# Patient Record
Sex: Female | Born: 1935 | Race: White | Hispanic: No | Marital: Single | Smoking: Never smoker
Health system: Southern US, Community
[De-identification: ages and names within clinical notes are randomized; demographics above are authoritative.]

## PROBLEM LIST (undated history)

## (undated) DIAGNOSIS — I4891 Unspecified atrial fibrillation: Secondary | ICD-10-CM

## (undated) DIAGNOSIS — E785 Hyperlipidemia, unspecified: Secondary | ICD-10-CM

## (undated) DIAGNOSIS — I10 Essential (primary) hypertension: Secondary | ICD-10-CM

## (undated) DIAGNOSIS — D126 Benign neoplasm of colon, unspecified: Secondary | ICD-10-CM

## (undated) DIAGNOSIS — R739 Hyperglycemia, unspecified: Secondary | ICD-10-CM

## (undated) DIAGNOSIS — M1990 Unspecified osteoarthritis, unspecified site: Secondary | ICD-10-CM

## (undated) DIAGNOSIS — I340 Nonrheumatic mitral (valve) insufficiency: Secondary | ICD-10-CM

## (undated) DIAGNOSIS — E0590 Thyrotoxicosis, unspecified without thyrotoxic crisis or storm: Secondary | ICD-10-CM

## (undated) DIAGNOSIS — I639 Cerebral infarction, unspecified: Secondary | ICD-10-CM

## (undated) HISTORY — DX: Benign neoplasm of colon, unspecified: D12.6

## (undated) HISTORY — DX: Unspecified atrial fibrillation: I48.91

## (undated) HISTORY — PX: OTHER SURGICAL HISTORY: SHX169

## (undated) HISTORY — DX: Unspecified osteoarthritis, unspecified site: M19.90

## (undated) HISTORY — DX: Nonrheumatic mitral (valve) insufficiency: I34.0

## (undated) HISTORY — DX: Cerebral infarction, unspecified: I63.9

## (undated) HISTORY — DX: Essential (primary) hypertension: I10

## (undated) HISTORY — DX: Thyrotoxicosis, unspecified without thyrotoxic crisis or storm: E05.90

## (undated) HISTORY — DX: Hyperlipidemia, unspecified: E78.5

## (undated) HISTORY — PX: ABDOMINAL HYSTERECTOMY: SHX81

## (undated) HISTORY — DX: Hyperglycemia, unspecified: R73.9

---

## 1998-02-08 ENCOUNTER — Other Ambulatory Visit: Admission: RE | Admit: 1998-02-08 | Discharge: 1998-02-08 | Payer: Self-pay | Admitting: *Deleted

## 1998-10-24 ENCOUNTER — Other Ambulatory Visit: Admission: RE | Admit: 1998-10-24 | Discharge: 1998-10-24 | Payer: Self-pay | Admitting: Otolaryngology

## 1998-11-30 ENCOUNTER — Inpatient Hospital Stay (HOSPITAL_COMMUNITY): Admission: RE | Admit: 1998-11-30 | Discharge: 1998-12-03 | Payer: Self-pay | Admitting: Otolaryngology

## 1999-04-29 ENCOUNTER — Other Ambulatory Visit: Admission: RE | Admit: 1999-04-29 | Discharge: 1999-04-29 | Payer: Self-pay | Admitting: *Deleted

## 1999-08-22 ENCOUNTER — Ambulatory Visit: Admission: RE | Admit: 1999-08-22 | Discharge: 1999-08-22 | Payer: Self-pay | Admitting: Endocrinology

## 1999-08-22 ENCOUNTER — Encounter: Payer: Self-pay | Admitting: Endocrinology

## 1999-11-25 ENCOUNTER — Ambulatory Visit (HOSPITAL_COMMUNITY): Admission: RE | Admit: 1999-11-25 | Discharge: 1999-11-25 | Payer: Self-pay | Admitting: Vascular Surgery

## 1999-11-25 ENCOUNTER — Encounter: Payer: Self-pay | Admitting: Vascular Surgery

## 1999-12-19 ENCOUNTER — Encounter: Payer: Self-pay | Admitting: Vascular Surgery

## 1999-12-19 ENCOUNTER — Observation Stay (HOSPITAL_COMMUNITY): Admission: RE | Admit: 1999-12-19 | Discharge: 1999-12-20 | Payer: Self-pay | Admitting: Vascular Surgery

## 2000-01-29 ENCOUNTER — Encounter: Payer: Self-pay | Admitting: Vascular Surgery

## 2000-01-29 ENCOUNTER — Ambulatory Visit (HOSPITAL_COMMUNITY): Admission: RE | Admit: 2000-01-29 | Discharge: 2000-01-29 | Payer: Self-pay | Admitting: Vascular Surgery

## 2000-04-17 ENCOUNTER — Encounter: Admission: RE | Admit: 2000-04-17 | Discharge: 2000-04-17 | Payer: Self-pay | Admitting: Endocrinology

## 2000-04-17 ENCOUNTER — Encounter: Payer: Self-pay | Admitting: Endocrinology

## 2000-05-05 ENCOUNTER — Other Ambulatory Visit: Admission: RE | Admit: 2000-05-05 | Discharge: 2000-05-05 | Payer: Self-pay | Admitting: *Deleted

## 2000-10-06 DIAGNOSIS — D126 Benign neoplasm of colon, unspecified: Secondary | ICD-10-CM

## 2000-10-06 HISTORY — DX: Benign neoplasm of colon, unspecified: D12.6

## 2000-11-02 ENCOUNTER — Other Ambulatory Visit: Admission: RE | Admit: 2000-11-02 | Discharge: 2000-11-02 | Payer: Self-pay | Admitting: Gastroenterology

## 2000-11-02 ENCOUNTER — Encounter (INDEPENDENT_AMBULATORY_CARE_PROVIDER_SITE_OTHER): Payer: Self-pay | Admitting: Specialist

## 2000-11-02 ENCOUNTER — Encounter: Payer: Self-pay | Admitting: Endocrinology

## 2000-11-02 ENCOUNTER — Encounter: Payer: Self-pay | Admitting: Gastroenterology

## 2000-11-23 ENCOUNTER — Encounter: Payer: Self-pay | Admitting: Gastroenterology

## 2000-11-23 ENCOUNTER — Encounter (INDEPENDENT_AMBULATORY_CARE_PROVIDER_SITE_OTHER): Payer: Self-pay

## 2000-11-23 ENCOUNTER — Ambulatory Visit (HOSPITAL_COMMUNITY): Admission: RE | Admit: 2000-11-23 | Discharge: 2000-11-23 | Payer: Self-pay | Admitting: Gastroenterology

## 2000-12-17 ENCOUNTER — Encounter: Payer: Self-pay | Admitting: General Surgery

## 2000-12-24 ENCOUNTER — Encounter (INDEPENDENT_AMBULATORY_CARE_PROVIDER_SITE_OTHER): Payer: Self-pay | Admitting: Specialist

## 2000-12-24 ENCOUNTER — Inpatient Hospital Stay (HOSPITAL_COMMUNITY): Admission: RE | Admit: 2000-12-24 | Discharge: 2000-12-28 | Payer: Self-pay | Admitting: General Surgery

## 2001-04-27 ENCOUNTER — Encounter: Payer: Self-pay | Admitting: *Deleted

## 2001-04-27 ENCOUNTER — Encounter: Admission: RE | Admit: 2001-04-27 | Discharge: 2001-04-27 | Payer: Self-pay | Admitting: *Deleted

## 2001-05-18 ENCOUNTER — Other Ambulatory Visit: Admission: RE | Admit: 2001-05-18 | Discharge: 2001-05-18 | Payer: Self-pay | Admitting: *Deleted

## 2001-12-24 ENCOUNTER — Encounter: Payer: Self-pay | Admitting: Gastroenterology

## 2002-05-09 ENCOUNTER — Encounter: Admission: RE | Admit: 2002-05-09 | Discharge: 2002-05-09 | Payer: Self-pay | Admitting: *Deleted

## 2002-05-09 ENCOUNTER — Encounter: Payer: Self-pay | Admitting: *Deleted

## 2002-12-01 ENCOUNTER — Ambulatory Visit (HOSPITAL_COMMUNITY): Admission: RE | Admit: 2002-12-01 | Discharge: 2002-12-01 | Payer: Self-pay | Admitting: Ophthalmology

## 2003-05-18 ENCOUNTER — Encounter: Payer: Self-pay | Admitting: Obstetrics and Gynecology

## 2003-05-18 ENCOUNTER — Encounter: Admission: RE | Admit: 2003-05-18 | Discharge: 2003-05-18 | Payer: Self-pay | Admitting: Obstetrics and Gynecology

## 2003-09-20 HISTORY — PX: OTHER SURGICAL HISTORY: SHX169

## 2004-05-29 ENCOUNTER — Encounter: Admission: RE | Admit: 2004-05-29 | Discharge: 2004-05-29 | Payer: Self-pay | Admitting: Obstetrics and Gynecology

## 2004-10-06 HISTORY — PX: COLON RESECTION: SHX5231

## 2004-10-15 ENCOUNTER — Ambulatory Visit: Payer: Self-pay | Admitting: Endocrinology

## 2004-10-23 ENCOUNTER — Ambulatory Visit: Payer: Self-pay | Admitting: Endocrinology

## 2004-12-04 ENCOUNTER — Ambulatory Visit: Payer: Self-pay | Admitting: Gastroenterology

## 2004-12-25 ENCOUNTER — Ambulatory Visit: Payer: Self-pay | Admitting: Gastroenterology

## 2005-04-27 ENCOUNTER — Emergency Department (HOSPITAL_COMMUNITY): Admission: EM | Admit: 2005-04-27 | Discharge: 2005-04-27 | Payer: Self-pay | Admitting: *Deleted

## 2005-06-10 ENCOUNTER — Encounter: Admission: RE | Admit: 2005-06-10 | Discharge: 2005-06-10 | Payer: Self-pay | Admitting: Obstetrics and Gynecology

## 2005-12-18 ENCOUNTER — Ambulatory Visit: Payer: Self-pay | Admitting: Endocrinology

## 2005-12-22 ENCOUNTER — Ambulatory Visit: Payer: Self-pay | Admitting: Family Medicine

## 2005-12-29 HISTORY — PX: OTHER SURGICAL HISTORY: SHX169

## 2006-06-15 ENCOUNTER — Encounter: Admission: RE | Admit: 2006-06-15 | Discharge: 2006-06-15 | Payer: Self-pay | Admitting: Obstetrics and Gynecology

## 2006-10-06 DIAGNOSIS — I4891 Unspecified atrial fibrillation: Secondary | ICD-10-CM

## 2006-10-06 HISTORY — DX: Unspecified atrial fibrillation: I48.91

## 2007-01-05 ENCOUNTER — Ambulatory Visit: Payer: Self-pay | Admitting: Endocrinology

## 2007-01-05 LAB — CONVERTED CEMR LAB
ALT: 22 units/L (ref 0–40)
Bilirubin, Direct: 0.1 mg/dL (ref 0.0–0.3)
CO2: 29 meq/L (ref 19–32)
Calcium: 9.3 mg/dL (ref 8.4–10.5)
Cholesterol: 137 mg/dL (ref 0–200)
GFR calc Af Amer: 80 mL/min
GFR calc non Af Amer: 66 mL/min
Glucose, Bld: 109 mg/dL — ABNORMAL HIGH (ref 70–99)
HDL: 61.2 mg/dL (ref >39.0)
Hgb A1c MFr Bld: 5.4 % (ref 4.6–6.0)
LDL Cholesterol: 62 mg/dL (ref 0–99)
Sodium: 142 meq/L (ref 135–145)
Total CHOL/HDL Ratio: 2.2
Total Protein: 6.3 g/dL (ref 6.0–8.3)
Triglycerides: 68 mg/dL (ref 0–149)

## 2007-05-10 ENCOUNTER — Ambulatory Visit: Payer: Self-pay | Admitting: Endocrinology

## 2007-05-10 LAB — CONVERTED CEMR LAB
Basophils Absolute: 0.1 10*3/uL (ref 0.0–0.1)
Calcium: 9.2 mg/dL (ref 8.4–10.5)
Chloride: 109 meq/L (ref 96–112)
Creatinine, Ser: 1 mg/dL (ref 0.4–1.2)
Eosinophils Absolute: 0.1 10*3/uL (ref 0.0–0.6)
Eosinophils Relative: 1.6 % (ref 0.0–5.0)
GFR calc non Af Amer: 58 mL/min
HCT: 34.4 % — ABNORMAL LOW (ref 36.0–46.0)
MCHC: 35 g/dL (ref 30.0–36.0)
MCV: 89.6 fL (ref 78.0–100.0)
Platelets: 217 10*3/uL (ref 150–400)
RBC: 3.83 M/uL — ABNORMAL LOW (ref 3.87–5.11)
RDW: 12.6 % (ref 11.5–14.6)
WBC: 7.3 10*3/uL (ref 4.5–10.5)

## 2007-05-18 ENCOUNTER — Ambulatory Visit: Payer: Self-pay

## 2007-06-04 ENCOUNTER — Encounter: Payer: Self-pay | Admitting: Endocrinology

## 2007-06-04 DIAGNOSIS — M810 Age-related osteoporosis without current pathological fracture: Secondary | ICD-10-CM

## 2007-06-04 DIAGNOSIS — I10 Essential (primary) hypertension: Secondary | ICD-10-CM

## 2007-06-04 DIAGNOSIS — M1990 Unspecified osteoarthritis, unspecified site: Secondary | ICD-10-CM | POA: Insufficient documentation

## 2007-06-22 ENCOUNTER — Encounter: Admission: RE | Admit: 2007-06-22 | Discharge: 2007-06-22 | Payer: Self-pay | Admitting: Obstetrics and Gynecology

## 2008-01-10 ENCOUNTER — Encounter: Payer: Self-pay | Admitting: Endocrinology

## 2008-01-10 ENCOUNTER — Ambulatory Visit: Payer: Self-pay | Admitting: Internal Medicine

## 2008-02-10 ENCOUNTER — Ambulatory Visit: Payer: Self-pay | Admitting: Endocrinology

## 2008-02-10 DIAGNOSIS — R739 Hyperglycemia, unspecified: Secondary | ICD-10-CM

## 2008-02-10 DIAGNOSIS — E7800 Pure hypercholesterolemia, unspecified: Secondary | ICD-10-CM | POA: Insufficient documentation

## 2008-02-10 DIAGNOSIS — H9190 Unspecified hearing loss, unspecified ear: Secondary | ICD-10-CM | POA: Insufficient documentation

## 2008-02-10 DIAGNOSIS — M25569 Pain in unspecified knee: Secondary | ICD-10-CM

## 2008-02-10 LAB — CONVERTED CEMR LAB
Albumin: 3.7 g/dL (ref 3.5–5.2)
Alkaline Phosphatase: 82 units/L (ref 39–117)
BUN: 11 mg/dL (ref 6–23)
Bacteria, UA: NEGATIVE
Basophils Absolute: 0 10*3/uL (ref 0.0–0.1)
Cholesterol: 145 mg/dL (ref 0–200)
Creatinine, Ser: 0.7 mg/dL (ref 0.4–1.2)
Crystals: NEGATIVE
Eosinophils Absolute: 0.1 10*3/uL (ref 0.0–0.7)
GFR calc Af Amer: 106 mL/min
GFR calc non Af Amer: 88 mL/min
HDL: 60.2 mg/dL (ref >39.0)
Hgb A1c MFr Bld: 5.3 % (ref 4.6–6.0)
Ketones, ur: NEGATIVE mg/dL
LDL Cholesterol: 74 mg/dL (ref 0–99)
Leukocytes, UA: NEGATIVE
Lymphocytes Relative: 16.9 % (ref 12.0–46.0)
MCHC: 33.7 g/dL (ref 30.0–36.0)
MCV: 89 fL (ref 78.0–100.0)
Neutrophils Relative %: 72.2 % (ref 43.0–77.0)
Nitrite: NEGATIVE
Platelets: 216 10*3/uL (ref 150–400)
Potassium: 4.8 meq/L (ref 3.5–5.1)
RBC / HPF: NONE SEEN
RDW: 13 % (ref 11.5–14.6)
Sed Rate: 12 mm/hr (ref 0–22)
Specific Gravity, Urine: 1.01 (ref 1.000–1.03)
TSH: 3.53 microintl units/mL (ref 0.35–5.50)
Urobilinogen, UA: 0.2 (ref 0.0–1.0)
VLDL: 11 mg/dL (ref 0–40)

## 2008-06-28 ENCOUNTER — Encounter: Admission: RE | Admit: 2008-06-28 | Discharge: 2008-06-28 | Payer: Self-pay | Admitting: Obstetrics and Gynecology

## 2008-06-29 ENCOUNTER — Encounter: Admission: RE | Admit: 2008-06-29 | Discharge: 2008-06-29 | Payer: Self-pay | Admitting: Obstetrics and Gynecology

## 2008-07-04 ENCOUNTER — Telehealth (INDEPENDENT_AMBULATORY_CARE_PROVIDER_SITE_OTHER): Payer: Self-pay | Admitting: *Deleted

## 2008-07-04 ENCOUNTER — Ambulatory Visit: Payer: Self-pay | Admitting: Endocrinology

## 2008-07-31 ENCOUNTER — Encounter: Payer: Self-pay | Admitting: Endocrinology

## 2008-09-14 ENCOUNTER — Ambulatory Visit: Payer: Self-pay | Admitting: Endocrinology

## 2008-09-14 DIAGNOSIS — IMO0002 Reserved for concepts with insufficient information to code with codable children: Secondary | ICD-10-CM | POA: Insufficient documentation

## 2008-09-14 LAB — CONVERTED CEMR LAB
Bilirubin Urine: NEGATIVE
Urobilinogen, UA: 0.2
pH: 5

## 2008-09-19 ENCOUNTER — Telehealth (INDEPENDENT_AMBULATORY_CARE_PROVIDER_SITE_OTHER): Payer: Self-pay | Admitting: *Deleted

## 2008-10-06 LAB — CONVERTED CEMR LAB: Pap Smear: NORMAL

## 2009-02-07 ENCOUNTER — Ambulatory Visit: Payer: Self-pay | Admitting: Endocrinology

## 2009-02-07 DIAGNOSIS — I498 Other specified cardiac arrhythmias: Secondary | ICD-10-CM | POA: Insufficient documentation

## 2009-02-07 LAB — CONVERTED CEMR LAB
ALT: 21 units/L (ref 0–35)
Albumin: 3.6 g/dL (ref 3.5–5.2)
Alkaline Phosphatase: 77 units/L (ref 39–117)
Basophils Relative: 0.2 % (ref 0.0–3.0)
Bilirubin Urine: NEGATIVE
CO2: 32 meq/L (ref 19–32)
Chloride: 111 meq/L (ref 96–112)
Eosinophils Absolute: 0.2 10*3/uL (ref 0.0–0.7)
Hemoglobin, Urine: NEGATIVE
Hemoglobin: 12.9 g/dL (ref 12.0–15.0)
Ketones, ur: NEGATIVE mg/dL
Lymphocytes Relative: 14.8 % (ref 12.0–46.0)
MCHC: 35.1 g/dL (ref 30.0–36.0)
MCV: 91 fL (ref 78.0–100.0)
Monocytes Absolute: 0.6 10*3/uL (ref 0.1–1.0)
Neutro Abs: 4.8 10*3/uL (ref 1.4–7.7)
Nitrite: NEGATIVE
RBC: 4.03 M/uL (ref 3.87–5.11)
Sodium: 148 meq/L — ABNORMAL HIGH (ref 135–145)
Total CHOL/HDL Ratio: 2
Total Protein, Urine: NEGATIVE mg/dL
Total Protein: 6.3 g/dL (ref 6.0–8.3)
Urine Glucose: NEGATIVE mg/dL

## 2009-02-14 ENCOUNTER — Telehealth: Payer: Self-pay | Admitting: Endocrinology

## 2009-04-23 ENCOUNTER — Telehealth: Payer: Self-pay | Admitting: Internal Medicine

## 2009-04-23 ENCOUNTER — Ambulatory Visit: Payer: Self-pay | Admitting: Internal Medicine

## 2009-04-23 ENCOUNTER — Encounter: Admission: RE | Admit: 2009-04-23 | Discharge: 2009-04-23 | Payer: Self-pay | Admitting: Internal Medicine

## 2009-04-23 DIAGNOSIS — I6350 Cerebral infarction due to unspecified occlusion or stenosis of unspecified cerebral artery: Secondary | ICD-10-CM | POA: Insufficient documentation

## 2009-04-24 ENCOUNTER — Telehealth: Payer: Self-pay | Admitting: Internal Medicine

## 2009-04-24 ENCOUNTER — Encounter: Admission: RE | Admit: 2009-04-24 | Discharge: 2009-04-24 | Payer: Self-pay | Admitting: Internal Medicine

## 2009-04-25 ENCOUNTER — Telehealth: Payer: Self-pay | Admitting: Internal Medicine

## 2009-04-25 LAB — CONVERTED CEMR LAB
BUN: 18 mg/dL (ref 6–23)
Bilirubin Urine: NEGATIVE
Bilirubin, Direct: 0.2 mg/dL (ref 0.0–0.3)
CO2: 30 meq/L (ref 19–32)
Chloride: 105 meq/L (ref 96–112)
Creatinine, Ser: 0.9 mg/dL (ref 0.4–1.2)
Eosinophils Absolute: 0 10*3/uL (ref 0.0–0.7)
Glucose, Bld: 112 mg/dL — ABNORMAL HIGH (ref 70–99)
HCT: 36 % (ref 36.0–46.0)
Hemoglobin, Urine: NEGATIVE
Leukocytes, UA: NEGATIVE
Lymphs Abs: 1 10*3/uL (ref 0.7–4.0)
MCHC: 35 g/dL (ref 30.0–36.0)
MCV: 90.6 fL (ref 78.0–100.0)
Monocytes Absolute: 0.7 10*3/uL (ref 0.1–1.0)
Neutrophils Relative %: 67.8 % (ref 43.0–77.0)
Nitrite: NEGATIVE
Platelets: 173 10*3/uL (ref 150.0–400.0)
RDW: 12.3 % (ref 11.5–14.6)
TSH: 2.08 microintl units/mL (ref 0.35–5.50)
Total Bilirubin: 0.7 mg/dL (ref 0.3–1.2)
Total Protein, Urine: NEGATIVE mg/dL
pH: 5 (ref 5.0–8.0)

## 2009-04-27 ENCOUNTER — Emergency Department (HOSPITAL_COMMUNITY): Admission: EM | Admit: 2009-04-27 | Discharge: 2009-04-28 | Payer: Self-pay | Admitting: Emergency Medicine

## 2009-04-30 ENCOUNTER — Ambulatory Visit: Payer: Self-pay | Admitting: Internal Medicine

## 2009-06-19 ENCOUNTER — Ambulatory Visit: Payer: Self-pay | Admitting: Endocrinology

## 2009-06-27 ENCOUNTER — Encounter: Payer: Self-pay | Admitting: Endocrinology

## 2009-06-27 ENCOUNTER — Ambulatory Visit: Payer: Self-pay

## 2009-07-03 ENCOUNTER — Telehealth: Payer: Self-pay | Admitting: Endocrinology

## 2009-07-04 ENCOUNTER — Telehealth: Payer: Self-pay | Admitting: Endocrinology

## 2009-07-05 ENCOUNTER — Telehealth: Payer: Self-pay | Admitting: Endocrinology

## 2009-07-09 ENCOUNTER — Telehealth: Payer: Self-pay | Admitting: Endocrinology

## 2009-07-10 ENCOUNTER — Ambulatory Visit: Payer: Self-pay | Admitting: Internal Medicine

## 2009-07-10 DIAGNOSIS — I080 Rheumatic disorders of both mitral and aortic valves: Secondary | ICD-10-CM | POA: Insufficient documentation

## 2009-07-12 ENCOUNTER — Ambulatory Visit (HOSPITAL_COMMUNITY): Admission: RE | Admit: 2009-07-12 | Discharge: 2009-07-12 | Payer: Self-pay | Admitting: Cardiology

## 2009-07-12 ENCOUNTER — Ambulatory Visit: Payer: Self-pay

## 2009-07-12 ENCOUNTER — Ambulatory Visit: Payer: Self-pay | Admitting: Internal Medicine

## 2009-07-12 ENCOUNTER — Ambulatory Visit: Payer: Self-pay | Admitting: Cardiology

## 2009-07-12 ENCOUNTER — Encounter: Payer: Self-pay | Admitting: Internal Medicine

## 2009-08-08 ENCOUNTER — Telehealth: Payer: Self-pay | Admitting: Endocrinology

## 2009-08-15 ENCOUNTER — Ambulatory Visit: Payer: Self-pay | Admitting: Internal Medicine

## 2009-08-15 DIAGNOSIS — I4891 Unspecified atrial fibrillation: Secondary | ICD-10-CM

## 2009-08-16 ENCOUNTER — Telehealth: Payer: Self-pay | Admitting: Internal Medicine

## 2009-08-16 ENCOUNTER — Encounter: Payer: Self-pay | Admitting: Endocrinology

## 2009-08-22 ENCOUNTER — Encounter: Admission: RE | Admit: 2009-08-22 | Discharge: 2009-08-22 | Payer: Self-pay | Admitting: Endocrinology

## 2009-08-24 ENCOUNTER — Telehealth: Payer: Self-pay | Admitting: Internal Medicine

## 2009-08-26 ENCOUNTER — Encounter: Admission: RE | Admit: 2009-08-26 | Discharge: 2009-08-26 | Payer: Self-pay | Admitting: Neurology

## 2009-09-19 ENCOUNTER — Telehealth: Payer: Self-pay | Admitting: Internal Medicine

## 2009-10-29 ENCOUNTER — Telehealth: Payer: Self-pay | Admitting: Endocrinology

## 2009-11-20 ENCOUNTER — Ambulatory Visit: Payer: Self-pay | Admitting: Internal Medicine

## 2009-11-20 DIAGNOSIS — R609 Edema, unspecified: Secondary | ICD-10-CM

## 2009-11-27 ENCOUNTER — Encounter: Payer: Self-pay | Admitting: Endocrinology

## 2009-12-17 ENCOUNTER — Ambulatory Visit: Payer: Self-pay | Admitting: Internal Medicine

## 2009-12-19 ENCOUNTER — Encounter (INDEPENDENT_AMBULATORY_CARE_PROVIDER_SITE_OTHER): Payer: Self-pay | Admitting: *Deleted

## 2009-12-20 ENCOUNTER — Telehealth: Payer: Self-pay | Admitting: Internal Medicine

## 2009-12-20 LAB — CONVERTED CEMR LAB
BUN: 14 mg/dL (ref 6–23)
Calcium: 9.3 mg/dL (ref 8.4–10.5)
GFR calc non Af Amer: 87.44 mL/min (ref >60)
Glucose, Bld: 81 mg/dL (ref 70–99)
Potassium: 3.9 meq/L (ref 3.5–5.1)
Sodium: 142 meq/L (ref 135–145)

## 2009-12-21 ENCOUNTER — Ambulatory Visit: Payer: Self-pay | Admitting: Internal Medicine

## 2009-12-31 ENCOUNTER — Encounter (INDEPENDENT_AMBULATORY_CARE_PROVIDER_SITE_OTHER): Payer: Self-pay | Admitting: *Deleted

## 2009-12-31 ENCOUNTER — Telehealth: Payer: Self-pay | Admitting: Internal Medicine

## 2009-12-31 LAB — CONVERTED CEMR LAB
Basophils Absolute: 0 10*3/uL (ref 0.0–0.1)
Eosinophils Relative: 0.8 % (ref 0.0–5.0)
HCT: 37.6 % (ref 36.0–46.0)
Lymphocytes Relative: 19.5 % (ref 12.0–46.0)
Lymphs Abs: 1.2 10*3/uL (ref 0.7–4.0)
Monocytes Relative: 10.7 % (ref 3.0–12.0)
Neutrophils Relative %: 68.3 % (ref 43.0–77.0)
Platelets: 166 10*3/uL (ref 150.0–400.0)
RDW: 12.9 % (ref 11.5–14.6)
WBC: 6.1 10*3/uL (ref 4.5–10.5)

## 2010-01-24 ENCOUNTER — Telehealth: Payer: Self-pay | Admitting: Internal Medicine

## 2010-01-30 ENCOUNTER — Telehealth: Payer: Self-pay | Admitting: Internal Medicine

## 2010-02-06 ENCOUNTER — Ambulatory Visit: Payer: Self-pay | Admitting: Gastroenterology

## 2010-02-06 DIAGNOSIS — Z860100 Personal history of colon polyps, unspecified: Secondary | ICD-10-CM | POA: Insufficient documentation

## 2010-02-06 DIAGNOSIS — D68318 Other hemorrhagic disorder due to intrinsic circulating anticoagulants, antibodies, or inhibitors: Secondary | ICD-10-CM | POA: Insufficient documentation

## 2010-02-06 DIAGNOSIS — H409 Unspecified glaucoma: Secondary | ICD-10-CM

## 2010-02-11 ENCOUNTER — Telehealth (INDEPENDENT_AMBULATORY_CARE_PROVIDER_SITE_OTHER): Payer: Self-pay | Admitting: *Deleted

## 2010-02-12 ENCOUNTER — Ambulatory Visit: Payer: Self-pay | Admitting: Gastroenterology

## 2010-02-14 ENCOUNTER — Encounter: Payer: Self-pay | Admitting: Gastroenterology

## 2010-03-05 ENCOUNTER — Telehealth: Payer: Self-pay | Admitting: Cardiology

## 2010-03-18 ENCOUNTER — Telehealth (INDEPENDENT_AMBULATORY_CARE_PROVIDER_SITE_OTHER): Payer: Self-pay | Admitting: *Deleted

## 2010-03-27 ENCOUNTER — Telehealth: Payer: Self-pay | Admitting: Internal Medicine

## 2010-04-15 ENCOUNTER — Encounter: Payer: Self-pay | Admitting: Endocrinology

## 2010-04-15 ENCOUNTER — Ambulatory Visit: Payer: Self-pay | Admitting: Internal Medicine

## 2010-04-25 ENCOUNTER — Ambulatory Visit: Payer: Self-pay | Admitting: Endocrinology

## 2010-04-25 LAB — CONVERTED CEMR LAB
Albumin: 4 g/dL (ref 3.5–5.2)
Alkaline Phosphatase: 86 units/L (ref 39–117)
BUN: 12 mg/dL (ref 6–23)
Basophils Absolute: 0 10*3/uL (ref 0.0–0.1)
CO2: 29 meq/L (ref 19–32)
Calcium: 9.5 mg/dL (ref 8.4–10.5)
Cholesterol: 125 mg/dL (ref 0–200)
Creatinine, Ser: 0.7 mg/dL (ref 0.4–1.2)
Eosinophils Absolute: 0 10*3/uL (ref 0.0–0.7)
GFR calc non Af Amer: 87.36 mL/min (ref >60)
Glucose, Bld: 104 mg/dL — ABNORMAL HIGH (ref 70–99)
HDL: 64.1 mg/dL (ref >39.00)
Hemoglobin: 13 g/dL (ref 12.0–15.0)
Lymphocytes Relative: 13.7 % (ref 12.0–46.0)
MCHC: 34.8 g/dL (ref 30.0–36.0)
Monocytes Relative: 9.3 % (ref 3.0–12.0)
Neutro Abs: 5 10*3/uL (ref 1.4–7.7)
Nitrite: NEGATIVE
Platelets: 180 10*3/uL (ref 150.0–400.0)
RDW: 13.2 % (ref 11.5–14.6)
Sodium: 146 meq/L — ABNORMAL HIGH (ref 135–145)
Specific Gravity, Urine: 1.015 (ref 1.000–1.030)
TSH: 2.72 microintl units/mL (ref 0.35–5.50)
Total Bilirubin: 0.8 mg/dL (ref 0.3–1.2)
Total Protein, Urine: NEGATIVE mg/dL
Triglycerides: 44 mg/dL (ref 0.0–149.0)
Urobilinogen, UA: 0.2 (ref 0.0–1.0)
VLDL: 8.8 mg/dL (ref 0.0–40.0)

## 2010-05-21 ENCOUNTER — Ambulatory Visit: Payer: Self-pay | Admitting: Internal Medicine

## 2010-06-21 ENCOUNTER — Emergency Department (HOSPITAL_COMMUNITY): Admission: EM | Admit: 2010-06-21 | Discharge: 2010-06-21 | Payer: Self-pay | Admitting: Emergency Medicine

## 2010-06-27 ENCOUNTER — Encounter: Payer: Self-pay | Admitting: Endocrinology

## 2010-06-27 DIAGNOSIS — I6529 Occlusion and stenosis of unspecified carotid artery: Secondary | ICD-10-CM | POA: Insufficient documentation

## 2010-06-28 ENCOUNTER — Encounter: Payer: Self-pay | Admitting: Cardiovascular Disease

## 2010-06-28 ENCOUNTER — Ambulatory Visit: Payer: Self-pay

## 2010-09-03 ENCOUNTER — Encounter: Admission: RE | Admit: 2010-09-03 | Discharge: 2010-09-03 | Payer: Self-pay | Admitting: Obstetrics and Gynecology

## 2010-09-09 ENCOUNTER — Telehealth: Payer: Self-pay | Admitting: Endocrinology

## 2010-10-27 ENCOUNTER — Encounter: Payer: Self-pay | Admitting: Endocrinology

## 2010-11-05 NOTE — Assessment & Plan Note (Signed)
Summary: F6M/DM   Referring Provider:  na Primary Provider:  Minus Breeding MD   History of Present Illness:   Nicole Hutchinson is seen in followup because of atrial fibrillation and bradycardia. She has mitral regurgitation with moderate left atrial enlargement by echo in the fall with a dimension of 48 mm. Her thromboembolic risk factors include hypertension gender age and prior thromboembolic episode demonstrated a MRI scanning.  She was started on Pradaxa   and she has tolerated this well.   The patient denies SOB, chest pain, or palpitations.  She saw Dr. Everardo All  a couple of weeks ago; he noted that her heart rate was slow. This left a cardiogram was reviewed and demonstrated sinus bradycardia at 56 with right bundle branch block.    Current Medications (verified): 1)  Mevacor 40 Mg  Tabs (Lovastatin) .... Take 2 By Mouth Qhs 2)  Losartan Potassium-Hctz 50-12.5 Mg Tabs (Losartan Potassium-Hctz) .... One Tablet By Mouth Once Dailt 3)  Pradaxa 150 Mg Caps (Dabigatran Etexilate Mesylate) .... Take One Capsule Two Times A Day 4)  Multivitamins  Tabs (Multiple Vitamin) .... Once Daily 5)  Xalatan 0.005 % Soln (Latanoprost) .... As Directed  Allergies (verified): 1)  ! Codeine 2)  ! Ace Inhibitors  Past History:  Past Medical History: Last updated: 01/31/2010 Mitral Regurgitation- moderate 2008 Hypertension Hyperthyroidism Osteoarthritis Osteoporosis Tubulovillous Adenoma of colon 2002 Dyslipidemia Hyperglycemia  Vital Signs:  Patient profile:   75 year old female Height:      54 inches Weight:      125 pounds BMI:     30.25 Pulse rate:   80 / minute Resp:     16 per minute BP sitting:   122 / 65  (right arm)  Vitals Entered By: Marrion Coy, CNA (May 21, 2010 1:37 PM)  Physical Exam  General:  The patient was alert and oriented in no acute distress. HEENT Normal.  Neck veins were flat, carotids were brisk.  Lungs were clear.  Heart sounds were regular without  murmurs or gallops.  Abdomen was soft with active bowel sounds. There is no clubbing cyanosis or edema. Skin Warm and dry     Impression & Recommendations:  Problem # 1:  ATRIAL FIBRILLATION (ICD-427.31) paroxysmal-on anticoagulation with Pradaxa  Problem # 2:  ESSENTIAL HYPERTENSION, BENIGN (ICD-401.1)  well-controlled Her updated medication list for this problem includes:    Losartan Potassium-hctz 50-12.5 Mg Tabs (Losartan potassium-hctz) ..... One tablet by mouth once dailt  Her updated medication list for this problem includes:    Losartan Potassium-hctz 50-12.5 Mg Tabs (Losartan potassium-hctz) ..... One tablet by mouth once dailt  Problem # 3:  BRADYCARDIA (ICD-427.89) bradycardia noted previously now in normal sinus rhythm; right bundle branch block is old  Patient Instructions: 1)  Your physician recommends that you schedule a follow-up appointment in: YEAR WITH DR Graciela Husbands 2)  Your physician recommends that you continue on your current medications as directed. Please refer to the Current Medication list given to you today.

## 2010-11-05 NOTE — Progress Notes (Signed)
Summary: lab results  Phone Note Call from Patient Call back at Home Phone [phone]   Caller: Patient Summary of Call: Pt calling for lab results Initial call taken by: Judie Grieve,  December 20, 2009 10:50 AM  Follow-up for Phone Call        called pt with results Follow-up by: Charolotte Capuchin, RN,  December 20, 2009 10:58 AM

## 2010-11-05 NOTE — Letter (Signed)
Summary: [facility]  [facility]   Imported By: Sherian Rein 11/29/2009 12:55:17  _____________________________________________________________________  External Attachment:    Type:   Image     Comment:   External Document

## 2010-11-05 NOTE — Progress Notes (Signed)
Summary: calling regarding form for medication  Phone Note Call from Patient Call back at Home Phone [phone]   Caller: Patient Summary of Call: Pt calling cheking on form for her medication Initial call taken by: Judie Grieve,  January 30, 2010 10:24 AM  Follow-up for Phone Call        Patient has form from January, she will use that form and call back with problems. Gypsy Balsam RN BSN  January 31, 2010 9:24 AM

## 2010-11-05 NOTE — Progress Notes (Signed)
Summary: mevacor  Phone Note Refill Request Message from:  Fax from Pharmacy on September 09, 2010 4:24 PM  Refills Requested: Medication #1:  MEVACOR 40 MG  TABS take 2 by mouth qhs   Dosage confirmed as above?Dosage Confirmed   Last Refilled: 09/09/2010  Method Requested: Electronic Next Appointment Scheduled: none Initial call taken by: Brenton Grills CMA Duncan Dull),  September 09, 2010 4:25 PM    Prescriptions: MEVACOR 40 MG  TABS (LOVASTATIN) take 2 by mouth qhs  #180 x 2   Entered by:   Brenton Grills CMA (AAMA)   Authorized by:   Minus Breeding MD   Signed by:   Brenton Grills CMA (AAMA) on 09/09/2010   Method used:   Electronically to        [facility]  [phone]* (retail)       [address]       [place], [place]  [address]       Ph: [phone] or [phone]       Fax: [phone]   RxID:   [id number]

## 2010-11-05 NOTE — Letter (Signed)
Summary: Patient Notice- Polyp Results  [facility]  [address] [place], [place] [address]   Phone: [phone]  Fax: [phone]        Feb 14, 2010 MRN: [id number]    Nicole Hutchinson [address] [place], [place]  [address]    Dear [name],  I am pleased to inform you that the colon polyp(s) removed during your recent colonoscopy was (were) found to be benign (no cancer detected) upon pathologic examination.  I recommend you have a repeat colonoscopy examination in _5 years to look for recurrent polyps, as having colon polyps increases your risk for having recurrent polyps or even colon cancer in the future.  Should you develop new or worsening symptoms of abdominal pain, bowel habit changes or bleeding from the rectum or bowels, please schedule an evaluation with either your primary care physician or with me.  Additional information/recommendations:  __ No further action with gastroenterology is needed at this time. Please      follow-up with your primary care physician for your other healthcare      needs.  __ Please call [phone] to schedule a return visit to review your      situation.  __ Please keep your follow-up visit as already scheduled.  __ Continue treatment plan as outlined the day of your exam.  Please call us if you are having persistent problems or have questions about your condition that have not been fully answered at this time.  Sincerely,  Louis Meckel MD  This letter has been electronically signed by your physician.  Appended Document: Patient Notice- Polyp Results letter mailed

## 2010-11-05 NOTE — Letter (Signed)
Summary: Colonoscopy Letter  [facility]  [address] [place], [place] [address]   Phone: [phone]  Fax: [phone]      December 19, 2009 MRN: [id number]   ASHLE STIEF [address] [place], [place]  [address]   Dear Ms. Franzel,   According to your medical record, it is time for you to schedule a Colonoscopy. The American Cancer Society recommends this procedure as a method to detect early colon cancer. Patients with a family history of colon cancer, or a personal history of colon polyps or inflammatory bowel disease are at increased risk.  This letter has beeen generated based on the recommendations made at the time of your procedure. If you feel that in your particular situation this may no longer apply, please contact our office.  Please call our office at [phone] to schedule this appointment or to update your records at your earliest convenience.  Thank you for cooperating with [place] to provide you with the very best care possible.   Sincerely,  Barbette Hair. Arlyce Dice, M.D.  [facility] Gastroenterology [facility] [phone]

## 2010-11-05 NOTE — Assessment & Plan Note (Signed)
Summary: per check out/saf   Referring Provider:  Romero Belling, MD Primary Provider:  Minus Breeding MD  CC:  pt states she is feeling pretty well.  She has no cardiac concerns at this time.  History of Present Illness:   Nicole Hutchinson is seen in followup because of atrial fibrillation and bradycardia. She has mitral regurgitation with moderate left atrial enlargement by echo in the fall with a dimension of 48 mm. Her thromboembolic risk factors include hypertension gender age and prior thromboembolic episode demonstrated a MRI scanning.  She was started on Pradaxa at her last visit  and she has tolerated this well. The patient denies SOB, chest pain, or palpitations   Allergies: 1)  ! Codeine 2)  ! Ace Inhibitors  Past History:  Past Medical History: Last updated: 07/10/2009 Mitral Regurgitation- moderate 2008 Hypertension Hyperthyroidism Osteoarthritis Osteoporosis Tubulovillous Adenoma of colon Dyslipidemia Hyperglycemia  Past Surgical History: Last updated: 06/04/2007 Hysterectomy (R) Hemi-Thyroidectomy Stress Cardiolite (09/20/2003) DEXA (12/29/2005)  Family History: Last updated: 02/10/2008 no cancer  Social History: Last updated: 04/30/2009 retired single Never Smoked Alcohol use-no Drug use-no Regular exercise-no  Vital Signs:  Patient profile:   75 year old female Height:      54 inches Weight:      135 pounds Pulse rate:   64 / minute Pulse rhythm:   regular BP sitting:   148 / 72  (right arm) Cuff size:   regular  Vitals Entered By: Nicole Hutchinson CMA (November 20, 2009 9:15 AM)  Physical Exam  General:  The patient was alert and oriented in no acute distress. HEENT Normal.  Neck veins were flat, carotids were brisk.  Lungs were clear.  Heart sounds were regular without murmurs or gallops.  Abdomen was soft with active bowel sounds. There is no clubbing cyanosis, 1+ edema Skin Warm and dry    Impression & Recommendations:  Problem  # 1:  ATRIAL FIBRILLATION (ICD-427.31) She has no recurrent atrial fibrillation by symptoms. She is taking Pradaxa and tolerating it well.  Problem # 2:  PERIPHERAL EDEMA (ICD-782.3) She has mild peripheral edema. Will change her losartan to include hydrochlorothiazide 12.5 mg daily.this will also help with her hypertension. We will check a metabolic profile in 4 weeks' time. I should note that her creatinine and potassium were normal last summer  Problem # 3:  HYPERTENSION (ICD-401.9) as above  The following medications were removed from the medication list:    Hydrochlorothiazide 25 Mg Tabs (Hydrochlorothiazide) .Marland Kitchen... Take one tablet by mouth daily. Her updated medication list for this problem includes:    Losartan Potassium-hctz 50-12.5 Mg Tabs (Losartan potassium-hctz) ..... One tablet by mouth once dailt  Patient Instructions: 1)  Your physician recommends that you schedule a follow-up appointment in: 6 MONTHS 2)  Your physician has recommended you make the following change in your medication: HYDROCHLORTHAZIDE 25MG  TAKE ONE HALF TABLET ONCE DAILY UNTIL GONE THEN  3)  START LOSARTAN HCT 50/12.5MG  ONE TABLET ONCE DAILY 4)  Your physician recommends that you return for lab work in: 4 WEEKS Prescriptions: LOSARTAN POTASSIUM-HCTZ 50-12.5 MG TABS (LOSARTAN POTASSIUM-HCTZ) ONE TABLET BY MOUTH ONCE DAILT  #90 x 4   Entered by:   Deliah Goody, RN   Authorized by:   Nathen May, MD, [facility]   Signed by:   Deliah Goody, RN on 11/20/2009   Method used:   Electronically to        [facility]  [phone]* (retail)  [address]       [place], [place]  [address]       Ph: [phone] or [phone]       Fax: [phone]   RxID:   [id number] HYDROCHLOROTHIAZIDE 25 MG TABS (HYDROCHLOROTHIAZIDE) Take one tablet by mouth daily.  #30 x 0   Entered by:   Deliah Goody, RN   Authorized by:   Nathen May, MD, [facility]   Signed by:   Deliah Goody, RN on 11/20/2009   Method used:   Electronically to        [facility]  [phone]* (retail)       [address]       [place], [place]  [address]       Ph: [phone] or [phone]       Fax: [phone]   RxID:   [id number]

## 2010-11-05 NOTE — Progress Notes (Signed)
  Phone Note Refill Request Message from:  Fax from Pharmacy on October 29, 2009 9:18 AM  Refills Requested: Medication #1:  FOSAMAX 70 MG  TABS TAKE 1 Q WEEK   Dosage confirmed as above?Dosage Confirmed Initial call taken by: Josph Macho CMA,  October 29, 2009 9:18 AM    Prescriptions: FOSAMAX 70 MG  TABS (ALENDRONATE SODIUM) TAKE 1 Q WEEK  #12 Each x 1   Entered by:   Josph Macho CMA   Authorized by:   Minus Breeding MD   Signed by:   Josph Macho CMA on 10/29/2009   Method used:   Electronically to        [facility]  [phone]* (retail)       [address]       [place], [place]  [address]       Ph: [phone] or [phone]       Fax: [phone]   RxID:   [id number]

## 2010-11-05 NOTE — Letter (Signed)
Summary: New Patient letter  [facility]  [address] [place], [place] [address]   Phone: [phone]  Fax: [phone]       12/31/2009 MRN: [id number]  Nicole Hutchinson [address] [place], [place]  [address]  Dear Ms. Nicole Hutchinson,  Welcome to the Gastroenterology Division at [facility].    You are scheduled to see Dr.  Arlyce Dice on 02/06/2010 at 9:00AM on the 3rd floor at [facility], [address]  We ask that you try to arrive at our office 15 minutes prior to your appointment time to allow for check-in.  We would like you to complete the enclosed self-administered evaluation form prior to your visit and bring it with you on the day of your appointment.  We will review it with you.  Also, please bring a complete list of all your medications or, if you prefer, bring the medication bottles and we will list them.  Please bring your insurance card so that we may make a copy of it.  If your insurance requires a referral to see a specialist, please bring your referral form from your primary care physician.  Co-payments are due at the time of your visit and may be paid by cash, check or credit card.     Your office visit will consist of a consult with your physician (includes a physical exam), any laboratory testing he/she may order, scheduling of any necessary diagnostic testing (e.g. x-ray, ultrasound, CT-scan), and scheduling of a procedure (e.g. Endoscopy, Colonoscopy) if required.  Please allow enough time on your schedule to allow for any/all of these possibilities.    If you cannot keep your appointment, please call [phone] to cancel or reschedule prior to your appointment date.  This allows [place] the opportunity to schedule an appointment for another patient in need of care.  If you do not cancel or reschedule by 5 p.m. the business day prior to your appointment date, you will be charged a $50.00 late cancellation/no-show fee.    Thank you for choosing  [facility] for your medical needs.  We appreciate the opportunity to care for you.  Please visit [place] at our website  to learn more about our practice.                     Sincerely,                                                             The Gastroenterology Division

## 2010-11-05 NOTE — Miscellaneous (Signed)
Summary: BONE DENSITY  Clinical Lists Changes  Orders: Added new Test order of T-Bone Densitometry (77080) - Signed Added new Test order of T-Lumbar Vertebral Assessment (77082) - Signed 

## 2010-11-05 NOTE — Procedures (Signed)
Summary: colonoscopy   Colonoscopy  Procedure date:  12/24/2001  Findings:      Results: Normal. Location:  [facility].    Comments:      Repeat colonoscopy in 3 years.    Colonoscopy  Procedure date:  12/24/2001  Findings:      Results: Normal. Location:  [facility].    Comments:      Repeat colonoscopy in 3 years.    Patient Name: Britney, Newstrom MRN:  Procedure Procedures: Colonoscopy CPT: [id number].  Personnel: Endoscopist: Barbette Hair. Arlyce Dice, MD.  Indications  Surveillance of: Adenomatous Polyp(s). 2002.  History  Pre-Exam Physical: Performed Dec 24, 2001. Entire physical exam was normal.  Exam Exam: Extent of exam reached: Cecum, extent intended: Cecum.  The cecum was identified by IC valve. Colon retroflexion performed. ASA Classification: II. Tolerance: good.  Monitoring: Pulse and BP monitoring, Oximetry used. Supplemental O2 given. at 2 Liters.  Colon Prep Used Golytely for colon prep. Prep results: good.  Sedation Meds: Fentanyl 50 mcg. given IV. Versed 2 mg. given IV.  Findings - NORMAL EXAM: Cecum to Sigmoid Colon.  PRIOR SURGERY: Sigmoid Colon. Segmental Colectomy.  - NORMAL EXAM: Sigmoid Colon to Rectum.   Assessment Normal examination.  Events  Unplanned Interventions: No intervention was required.  Unplanned Events: There were no complications. Plans Patient Education: Patient given standard instructions for: a normal exam.  Scheduling/Referral: Colonoscopy, to Molly Maduro D. Arlyce Dice, MD, around Dec 24, 2004.    This report was created from the original endoscopy report, which was reviewed and signed by the above listed endoscopist.

## 2010-11-05 NOTE — Progress Notes (Signed)
Summary: CALL RE MED  Phone Note Call from Patient Call back at Home Phone [phone]   Caller: Patient [phone] Reason for Call: Talk to Nurse Summary of Call: [facility] MED-PT HAS QUESTIONS ABOUT IT Initial call taken by: Glynda Jaeger,  March 18, 2010 3:12 PM  Follow-up for Phone Call        waiting  for approval for assistance with medication, reuqesting samples.  Pradaxa 150 mg capsules #24 lot [id number] exp 1/12 left at front desk for pt. she is aware. Follow-up by: Charolotte Capuchin, RN,  March 18, 2010 4:03 PM

## 2010-11-05 NOTE — Progress Notes (Signed)
Summary: lab results today  Phone Note Call from Patient Call back at Home Phone [phone]   Caller: Patient Reason for Call: Talk to Nurse, Lab or Test Results Summary of Call: Patient would like lab results today.  Drawn on 12-21-09. Initial call taken by: Burnard Leigh,  December 31, 2009 2:17 PM  Follow-up for Phone Call        Pt aware Follow-up by: Duncan Dull, RN, BSN,  December 31, 2009 2:41 PM

## 2010-11-05 NOTE — Progress Notes (Signed)
Summary: refill/call  Phone Note Call from Patient Call back at Home Phone [phone]   Caller: Patient Summary of Call: please call pt once rx sent Initial call taken by: Migdalia Dk,  December 20, 2009 11:47 AM  Follow-up for Phone Call        Received requests for Pradaxa refills.  Pt has not had  CBC since prescribed.  Forwarded to [employer] nurse for authorization.  Judithe Modest, CMA/AAMA Follow-up by: Judithe Modest CMA,  December 20, 2009 2:14 PM  Additional Follow-up for Phone Call Additional follow up Details #1::        Called to patient home--spoke with sister (with pt permission--hearing aid out).  Explained that pt needs to have CBC drawn, and that refills are at [facility].  She verbalizes understanding and will bring sister by today.   Additional Follow-up by: Shelby Dubin PharmD, BCPS, CPP,  December 21, 2009 8:59 AM

## 2010-11-05 NOTE — Miscellaneous (Signed)
Summary: Orders Update  Clinical Lists Changes  Problems: Added new problem of CAROTID ARTERY DISEASE (ICD-433.10) Orders: Added new Test order of Carotid Duplex (Carotid Duplex) - Signed 

## 2010-11-05 NOTE — Progress Notes (Signed)
Summary: Questions about procedure  Phone Note Call from Patient Call back at Home Phone [phone]   Caller: Patient Call For: Dr. Arlyce Dice Reason for Call: Talk to Nurse Summary of Call: pt. ate a few strawberries on Saturday and wants to know if that will be okay? Appt. tomorrow Initial call taken by: Karna Christmas,  Feb 11, 2010 8:35 AM  Follow-up for Phone Call        talked with patient's sister.  Told her that it is okay that pt ate strawberries.  Reviewed clear liquid list with sister. Follow-up by: Ezra Sites RN,  Feb 11, 2010 8:55 AM

## 2010-11-05 NOTE — Progress Notes (Signed)
Summary: pt has questions regarding paradaxa  Phone Note Call from Patient Call back at Home Phone [phone]   Caller: Patient Reason for Call: Talk to Nurse, Talk to Doctor Summary of Call: pt has a question regarding paradaxa Initial call taken by: Omer Jack,  March 27, 2010 3:37 PM  Follow-up for Phone Call        Spoke with pt. Pt. was started on Pradaxa 150 mg twice a day. She is running out of the samples that was given. Pt. would like to know if she can take only one tablet untill she is able to fill the prescription given. I let pt. know it is important for her to take one 150 mg tablet twice a day. A 12/150 mg tablet sample placed at front desk for pt. to peak up. Pt. aware. Follow-up by: Ollen Gross, RN, BSN,  March 27, 2010 4:09 PM

## 2010-11-05 NOTE — Assessment & Plan Note (Signed)
Summary: REC COL/PRADAXA...AS.   History of Present Illness Visit Type: new patient  Primary GI MD: Melvia Heaps MD [facility] Primary Provider: Minus Breeding MD Requesting Provider: n/a Chief Complaint: Consult colon. Pt denies any GI complaints History of Present Illness:   Nicole Hutchinson is a pleasant 75 year old white female with a history of colon polyps, h/o CVA, mitral regurgitation, atrial fibrillation, hypertension and hyperthyroidism referred for a followup colonoscopy.  In 2002 she underwent a segmental colectomy of the sigmoid colon because of a cluster of villous adenomas.  Last colonoscopy in 2006 was unremarkable.  She has no GI complaints including change of bowel habits, abdominal pain, melena or hematochezia.  She is on Pradaxa because of a CVA.   GI Review of Systems      Denies abdominal pain, acid reflux, belching, bloating, chest pain, dysphagia with liquids, dysphagia with solids, heartburn, loss of appetite, nausea, vomiting, vomiting blood, weight loss, and  weight gain.        Denies anal fissure, black tarry stools, change in bowel habit, constipation, diarrhea, diverticulosis, fecal incontinence, heme positive stool, hemorrhoids, irritable bowel syndrome, jaundice, light color stool, liver problems, rectal bleeding, and  rectal pain.    Current Medications (verified): 1)  Mevacor 40 Mg  Tabs (Lovastatin) .... Take 2 By Mouth Qhs 2)  Fosamax 70 Mg  Tabs (Alendronate Sodium) .... Take 1 Q Week 3)  Losartan Potassium-Hctz 50-12.5 Mg Tabs (Losartan Potassium-Hctz) .... One Tablet By Mouth Once Dailt 4)  Pradaxa 150 Mg Caps (Dabigatran Etexilate Mesylate) .... Take One Capsule Two Times A Day 5)  Multivitamins  Tabs (Multiple Vitamin) .... Once Daily  Allergies (verified): 1)  ! Codeine 2)  ! Ace Inhibitors  Past History:  Past Medical History: Reviewed history from 01/31/2010 and no changes required. Mitral Regurgitation- moderate  2008 Hypertension Hyperthyroidism Osteoarthritis Osteoporosis Tubulovillous Adenoma of colon 2002 Dyslipidemia Hyperglycemia  Past Surgical History: Hysterectomy (R) Hemi-Thyroidectomy Stress Cardiolite (09/20/2003) DEXA (12/29/2005) Colon Resection  Family History: Reviewed history from 02/10/2008 and no changes required. no cancer  Social History: Reviewed history from 04/30/2009 and no changes required. retired single Never Smoked Alcohol use-no Drug use-no Regular exercise-no  Review of Systems       The patient complains of allergy/sinus, hearing problems, and swelling of feet/legs.  The patient denies anemia, anxiety-new, arthritis/joint pain, back pain, blood in urine, breast changes/lumps, change in vision, confusion, cough, coughing up blood, depression-new, fainting, fatigue, fever, headaches-new, heart murmur, heart rhythm changes, itching, menstrual pain, muscle pains/cramps, night sweats, nosebleeds, pregnancy symptoms, shortness of breath, skin rash, sleeping problems, sore throat, swollen lymph glands, thirst - excessive , urination - excessive , urination changes/pain, urine leakage, vision changes, and voice change.         All other systems were reviewed and were negative   Vital Signs:  Patient profile:   75 year old female Height:      54 inches Weight:      126 pounds BMI:     30.49 BSA:     1.42 Pulse rate:   62 / minute Pulse rhythm:   regular BP sitting:   124 / 68  (left arm) Cuff size:   regular  Vitals Entered By: Ok Anis CMA (Feb 06, 2010 9:11 AM)  Physical Exam  Additional Exam:  She is a well-developed well-nourished female  skin: anicteric HEENT: normocephalic; PEERLA; no nasal or pharyngeal abnormalities neck: supple nodes: no cervical lymphadenopathy chest: clear to ausculatation and percussion heart:  no murmurs, gallops, or rubs abd: soft, nontender; BS normoactive; no abdominal masses, tenderness, organomegaly rectal:  deferred ext: no cynanosis, clubbing; there is 1+ ankle edema skeletal: no deformities neuro: oriented x 3; no focal abnormalities    Impression & Recommendations:  Problem # 1:  PERSONAL HISTORY OF COLONIC POLYPS (ICD-V12.72)  Patient will be scheduled for followup colonoscopy.  Pradaxa  will be held in advance of the procedure..  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  6)  Conscious Sedation brochure given.  7)  The medication list was reviewed and reconciled.  All changed / newly prescribed medications were explained.  A complete medication list was provided to the patient / caregiver. Prescriptions: MOVIPREP 100 GM  SOLR (PEG-KCL-NACL-NASULF-NA ASC-C) As per prep instructions.  #1 x 0   Entered by:   Merri Ray CMA (AAMA)   Authorized by:   Louis Meckel MD   Signed by:   Merri Ray CMA (AAMA) on 02/06/2010   Method used:   Electronically to        [facility]  [phone]* (retail)       [address]       [place], [place]  [address]       Ph: [phone] or [phone]       Fax: [phone]   RxID:   [id number]

## 2010-11-05 NOTE — Procedures (Signed)
Summary: Colonoscopy  Patient: Nicole Hutchinson Note: All result statuses are Final unless otherwise noted.  Tests: (1) Colonoscopy (COL)   COL Colonoscopy           DONE     [facility]     [address]     [place], [place]  [address]           COLONOSCOPY PROCEDURE REPORT           PATIENT:  Nicole, Hutchinson  MR#:  [id number]     BIRTHDATE:  09-30-1936, 74 yrs. old  GENDER:  female           ENDOSCOPIST:  Barbette Hair. Arlyce Dice, MD     Referred by:           PROCEDURE DATE:  02/12/2010     PROCEDURE:     ASA CLASS:  Class II     INDICATIONS:  1) screening  2) history of pre-cancerous     (adenomatous) colon polyps;  s/p partial colectomy for polyposis           MEDICATIONS:   Fentanyl 25 mcg IV, Versed 4 mg IV           DESCRIPTION OF PROCEDURE:   After the risks benefits and     alternatives of the procedure were thoroughly explained, informed     consent was obtained.  Digital rectal exam was performed and     revealed no abnormalities.   The LB [id number] endoscope     was introduced through the anus and advanced to the cecum, which     was identified by the ileocecal valve, without limitations.  The     quality of the prep was excellent, using MoviPrep.  The instrument     was then slowly withdrawn as the colon was fully examined.     <<PROCEDUREIMAGES>>           FINDINGS:  A sessile polyp was found in the descending colon. It     was 4 mm in size. Polyp was snared without cautery. Retrieval was     successful (see image7). snare polyp  This was otherwise a normal     examination of the colon (see image1, image3, image5, image9,     image10, image11, and image13).   Retroflexed views in the rectum     revealed no abnormalities.    The time to cecum =  3.25  minutes.     The scope was then withdrawn (time =  6.25  min) from the patient     and the procedure completed.           COMPLICATIONS:  None           ENDOSCOPIC IMPRESSION:     1) 4 mm sessile  polyp in the descending colon     2) Otherwise normal examination     RECOMMENDATIONS:     1) If the polyp(s) removed today are proven to be adenomatous     (pre-cancerous) polyps, you will need a repeat colonoscopy in 5     years. Otherwise you should continue to follow colorectal cancer     screening guidelines for "routine risk" patients with colonoscopy     in 10 years.     2) resume pradaxa in 5 days           REPEAT EXAM:  No           ______________________________  Barbette Hair. Arlyce Dice, MD           CC: Minus Breeding, MD           n.     Rosalie DoctorBarbette Hair. Kaplan at 02/12/2010 02:43 PM           Wynona Canes, [phone]  Note: An exclamation mark (!) indicates a result that was not dispersed into the flowsheet. Document Creation Date: 02/12/2010 2:44 PM _______________________________________________________________________  (1) Order result status: Final Collection or observation date-time: 02/12/2010 14:37 Requested date-time:  Receipt date-time:  Reported date-time:  Referring Physician:   Ordering Physician: Melvia Heaps [phone]) Specimen Source:  Source: Launa Grill Order Number: [id number] Lab site:   Appended Document: Colonoscopy recall in 5 yrs/02-2015     Procedures Next Due Date:    Colonoscopy: 02/2015

## 2010-11-05 NOTE — Procedures (Signed)
Summary: Flexible Sigmoidoscopy   Flexible Sigmoidoscopy  Procedure date:  11/23/2000  Findings:      211.3:Tubulovillous Adenoma  Comments:      Location: [facility].  [facility] Reports-CCC] Patient Name: Nicole Hutchinson, Nicole Hutchinson MRN:  Procedure Procedures: Flexible Proctosigmoidoscopy CPT: [phone].    with polypectomy. CPT: I988382.  Personnel: Endoscopist: Barbette Hair. Arlyce Dice, MD.  Exam Location: Exam performed in Endoscopy Suite.  Patient Consent: Procedure, Alternatives, Risks and Benefits discussed, consent obtained,  Indications  Surveillance of: Adenomatous Polyp(s). Last exam: Feb, 2002.  History  Pre-Exam Physical: Performed Nov 23, 2000. Cardio-pulmonary exam  WNL. Rectal exam, HEENT exam , Abdominal exam, Extremity exam, Neurological exam, Mental status exam WNL.  Exam Exam: Extent visualized: Sigmoid Colon. Extent of exam: 30 cm. ASA Classification: II. Tolerance: excellent.  Monitoring: Pulse and BP monitoring, Oximetry used. Supplemental O2 given.  Colon Prep Used Fleets enema for colon prep. Prep: good.  Fluoroscopy: Fluoroscopy was not used.  Sedation Meds: Fentanyl 40 Versed 4 mg.  Findings POLYP: Sigmoid Colon, Maximum size: 40 mm. sessile polyp. Distance from Anus 18 cm. Procedure:  snare with cautery, The polyp was removed piece meal. ICD9: Colon Polyps: 211.3. Comments: Polyp partially removed.   Assessment Abnormal examination, see findings above.  Diagnoses: 211.3: Colon Polyps.   Events  Unplanned Intervention: No intervention was required.  Plans Medication Plan: Await pathology.  Disposition: After procedure patient sent to recovery.  This report was created from the original endoscopy report, which was reviewed and signed by the above listed endoscopist.

## 2010-11-05 NOTE — Progress Notes (Signed)
Summary: req call back  Phone Note Call from Patient Call back at Home Phone [phone]   Caller: Patient Reason for Call: Talk to Nurse Summary of Call: request call back from nurse Initial call taken by: Migdalia Dk,  January 24, 2010 2:28 PM  Follow-up for Phone Call        Talked with Nicole Hutchinson about needing a new Pradaxa assistance form..  Will mail her a new form after being signed by md. Follow-up by: [name] RN,  January 24, 2010 2:52 PM

## 2010-11-05 NOTE — Procedures (Signed)
Summary: colonoscopy   Colonoscopy  Procedure date:  11/02/2000  Findings:      Pathology:  Adenomatous polyp.        Location:  [facility].   Patient Name: Nicole Hutchinson, Nicole Hutchinson MRN:  Procedure Procedures: Colonoscopy CPT: [phone].    with biopsy. CPT: Q5068410.  Personnel: Endoscopist: Barbette Hair. Arlyce Dice, MD.  Referred By: Titus Dubin Alwyn Ren, MD.  Exam Location: Exam performed in Outpatient Clinic. Outpatient  Patient Consent: Procedure, Alternatives, Risks and Benefits discussed, consent obtained, from patient.  Indications  Surveillance of: Adenomatous Polyp(s).  History  Pre-Exam Physical: Performed Nov 02, 2000. Cardio-pulmonary exam, Rectal exam, HEENT exam , Abdominal exam, Extremity exam, Neurological exam, Mental status exam WNL.  Exam Exam: Extent of exam reached: Cecum, extent intended: Cecum.  ASA Classification: II. Tolerance: excellent.  Monitoring: Pulse and BP monitoring, Oximetry used. Supplemental O2 given.  Colon Prep Used Golytely for colon prep. Prep results: fair, adequate exam.  Sedation Meds: Fentanyl 50 mcg. Versed 5 mg.  Findings - POLYP: Sigmoid Colon, Maximum size: 30 mm. Distance from Anus 15 cm. ICD9: Colon Polyps: 211.3. Comments: Cluster of polyps covering approximately 3 cm.   Assessment Abnormal examination, see findings above.  Diagnoses: 211.3: Colon Polyps.   Events  Unplanned Interventions: No intervention was required.  Unplanned Events: There were no complications. Plans Medication Plan: Await pathology.  Disposition: After procedure patient sent to recovery. After recovery patient sent home.  Scheduling/Referral: Office Visit, to [employer]. Arlyce Dice, MD, around Nov 16, 2000.    This report was created from the original endoscopy report, which was reviewed and signed by the above listed endoscopist.

## 2010-11-05 NOTE — Letter (Signed)
Summary: [facility] Instructions  [facility]  [address] [place], [place] [address]   Phone: [phone]  Fax: [phone]       Nicole Hutchinson    [birth year]    MRN: [id number]        Procedure Day /Date:TUESDAY 02/12/2010     Arrival Time:1PM     Procedure Time:2PM     Location of Procedure:                    X   [facility] (4th Floor)   PREPARATION FOR COLONOSCOPY WITH MOVIPREP   Starting 5 days prior to your procedure 02/07/2010 do not eat nuts, seeds, popcorn, corn, beans, peas,  salads, or any raw vegetables.  Do not take any fiber supplements (e.g. Metamucil, Citrucel, and Benefiber).  THE DAY BEFORE YOUR PROCEDURE         DATE: 02/13/2010  DAY: MONDAY  1.  Drink clear liquids the entire day-NO SOLID FOOD  2.  Do not drink anything colored red or purple.  Avoid juices with pulp.  No orange juice.  3.  Drink at least 64 oz. (8 glasses) of fluid/clear liquids during the day to prevent dehydration and help the prep work efficiently.  CLEAR LIQUIDS INCLUDE: Water Jello Ice Popsicles Tea (sugar ok, no milk/cream) Powdered fruit flavored drinks Coffee (sugar ok, no milk/cream) Gatorade Juice: apple, white grape, white cranberry  Lemonade Clear bullion, consomm, broth Carbonated beverages (any kind) Strained chicken noodle soup Hard Candy                             4.  In the morning, mix first dose of MoviPrep solution:    Empty 1 Pouch A and 1 Pouch B into the disposable container    Add lukewarm drinking water to the top line of the container. Mix to dissolve    Refrigerate (mixed solution should be used within 24 hrs)  5.  Begin drinking the prep at 5:00 p.m. The MoviPrep container is divided by 4 marks.   Every 15 minutes drink the solution down to the next mark (approximately 8 oz) until the full liter is complete.   6.  Follow completed prep with 16 oz of clear liquid of your choice (Nothing red or purple).  Continue to  drink clear liquids until bedtime.  7.  Before going to bed, mix second dose of MoviPrep solution:    Empty 1 Pouch A and 1 Pouch B into the disposable container    Add lukewarm drinking water to the top line of the container. Mix to dissolve    Refrigerate  THE DAY OF YOUR PROCEDURE      DATE:02/14/2010 ONG:EXBMWUX  Beginning at 9a.m. (5 hours before procedure):         1. Every 15 minutes, drink the solution down to the next mark (approx 8 oz) until the full liter is complete.  2. Follow completed prep with 16 oz. of clear liquid of your choice.    3. You may drink clear liquids until 12PM (2 HOURS BEFORE PROCEDURE).   MEDICATION INSTRUCTIONS  Unless otherwise instructed, you should take regular prescription medications with a small sip of water   as early as possible the morning of your procedure.   Stop taking Pradaxa on the 8th of May per Dr Arlyce Dice, Only 2 days required to hold per Dr Arlyce Dice  OTHER INSTRUCTIONS  You will need a responsible adult at least 73 years of age to accompany you and drive you home.   This person must remain in the waiting room during your procedure.  Wear loose fitting clothing that is easily removed.  Leave jewelry and other valuables at home.  However, you may wish to bring a book to read or  an iPod/MP3 player to listen to music as you wait for your procedure to start.  Remove all body piercing jewelry and leave at home.  Total time from sign-in until discharge is approximately 2-3 hours.  You should go home directly after your procedure and rest.  You can resume normal activities the  day after your procedure.  The day of your procedure you should not:   Drive   Make legal decisions   Operate machinery   Drink alcohol   Return to work  You will receive specific instructions about eating, activities and medications before you leave.    The above instructions have been reviewed and explained to me by    _______________________    I fully understand and can verbalize these instructions _____________________________ Date _________

## 2010-11-05 NOTE — Assessment & Plan Note (Signed)
Summary: YEARLY--STC   Vital Signs:  Patient profile:   75 year old female Height:      54 inches (137.16 cm) Weight:      122.50 pounds (55.68 kg) BMI:     29.64 O2 Sat:      97 % on Room air Temp:     97.7 degrees F (36.50 degrees C) oral Pulse rate:   62 / minute Pulse rhythm:   regular BP sitting:   132 / 72  (left arm) Cuff size:   regular  Vitals Entered By: Brenton Grills MA (April 25, 2010 8:29 AM)  O2 Flow:  Room air CC: Yearly F/U/aj  Vision Screening:Left eye with correction: 20 / 50 Right eye with correction: 20 / 40 Both eyes with correction: 20 / 40        Vision Entered By: Brenton Grills MA (April 25, 2010 9:11 AM)   Referring Provider:  na Primary Provider:  Minus Breeding MD  CC:  Yearly F/U/aj.  History of Present Illness: pt is here for medicare welllness visit.  she denies memory loss and depression.  she says she is able to perform activities of daily living without assistance.  she has no limitations to physical activity. she has been on fosamax x 5 years  Current Medications (verified): 1)  Mevacor 40 Mg  Tabs (Lovastatin) .... Take 2 By Mouth Qhs 2)  Fosamax 70 Mg  Tabs (Alendronate Sodium) .... Take 1 Q Week 3)  Losartan Potassium-Hctz 50-12.5 Mg Tabs (Losartan Potassium-Hctz) .... One Tablet By Mouth Once Dailt 4)  Pradaxa 150 Mg Caps (Dabigatran Etexilate Mesylate) .... Take One Capsule Two Times A Day 5)  Multivitamins  Tabs (Multiple Vitamin) .... Once Daily  Allergies (verified): 1)  ! Codeine 2)  ! Ace Inhibitors  Past History:  Past Medical History: Last updated: 01/31/2010 Mitral Regurgitation- moderate 2008 Hypertension Hyperthyroidism Osteoarthritis Osteoporosis Tubulovillous Adenoma of colon 2002 Dyslipidemia Hyperglycemia  Family History: Reviewed history from 02/10/2008 and no changes required. no cancer  Social History: Reviewed history from 04/30/2009 and no changes required. retired single--never  married Never Smoked Alcohol use-no Drug use-no Regular exercise-some walking  Physical Exam  General:  normal appearance.   Ears:  has bilat hearing aids.  normal hearing.   Breasts:  sees gyn  Rectal:  sees gyn  Genitalia:  sees gyn  Msk:  pt easily and quickly performs "get-up-and-go" from a sitting position. Psych:  remembers 3/3 at 5 minutes.  excellent recall.  can easily read and write a sentence.  alert and oriented x 3    Impression & Recommendations:  Problem # 1:  ROUTINE GENERAL MEDICAL EXAM@HEALTH  CARE FACL (ICD-V70.0)  Problem # 2:  OSTEOPOROSIS (ICD-733.00) she has completed course of fosamax  Other Orders: T-Parathyroid Hormone, Intact w/ Calcium ([address]) EKG w/ Interpretation (93000) TLB-Lipid Panel (80061-LIPID) TLB-BMP (Basic Metabolic Panel-BMET) (80048-METABOL) TLB-CBC Platelet - w/Differential (85025-CBCD) TLB-Hepatic/Liver Function Pnl (80076-HEPATIC) TLB-TSH (Thyroid Stimulating Hormone) (84443-TSH) TLB-A1C / Hgb A1C (Glycohemoglobin) (83036-A1C) TLB-Udip w/ Micro (81001-URINE) First annual wellness visit with prevention plan  (U9811)  Patient Instructions: 1)  stop fosamax 2)  please consider these measures for your health:  minimize alcohol.  do not use tobacco products.  have a colonoscopy at least every 10 years from age 20.  keep firearms safely stored.  always use seat belts.  have working smoke alarms in your home.  see an eye doctor and dentist regularly.  never drive under the influence of alcohol or drugs (  including prescription drugs).  those with fair skin should take precautions against the sun. 3)  please let me know what your wishes would be, if artificial life support measures should become necessary.  it is critically important to prevent falling down (keep floor areas well-lit, dry, and free of loose objects) 4)  here is a list of medicare-covered preventive services 5)  it is critically important to maintain a healthy body  weight.  i would be happy to refer you to a dietician.  we are checking for diabetes and cholesterol today. 6)  (update:  we discussed code status.  pt requests full code, but would not want to be started or maintained on artificial life-support measures if there was not a reasonable chance of recovery) Prescriptions: LOSARTAN POTASSIUM-HCTZ 50-12.5 MG TABS (LOSARTAN POTASSIUM-HCTZ) ONE TABLET BY MOUTH ONCE DAILT  #90 x 3   Entered and Authorized by:   Minus Breeding MD   Signed by:   Minus Breeding MD on 04/25/2010   Method used:   Electronically to        [facility]  [phone]* (retail)       [address]       [place], [place]  [address]       Ph: [phone] or [phone]       Fax: [phone]   RxID:   [id number] MEVACOR 40 MG  TABS (LOVASTATIN) take 2 by mouth qhs  #90 x 3   Entered and Authorized by:   Minus Breeding MD   Signed by:   Minus Breeding MD on 04/25/2010   Method used:   Electronically to        [facility]  [phone]* (retail)       [address]       [place], [place]  [address]       Ph: [phone] or [phone]       Fax: [phone]   RxID:   [id number]    Preventive Care Screening  Mammogram:    Date:  07/06/2009    Results:  normal   Pap Smear:    Date:  10/06/2008    Results:  normal      gyn is dr Elana Alm    Immunization History:  Influenza Immunization History:    Influenza:  historical (08/06/2009)

## 2010-11-05 NOTE — Procedures (Signed)
Summary: colonoscopy   Colonoscopy  Procedure date:  12/25/2004  Findings:      Results: Normal. Location:  [facility].    Comments:      Repeat colonoscopy in 5 years.  Patient Name: Nicole, Hutchinson MRN:  Procedure Procedures: Colonoscopy CPT: [phone].  Personnel: Endoscopist: Barbette Hair. Arlyce Dice, MD.  Patient Consent: Procedure, Alternatives, Risks and Benefits discussed, consent obtained, from patient.  Indications  Surveillance of: Adenomatous Polyp(s).  History  Current Medications: Patient is not currently taking Coumadin.  Pre-Exam Physical: Performed Dec 25, 2004. Cardio-pulmonary exam, HEENT exam , Abdominal exam WNL.  Exam Exam: Extent of exam reached: Cecum, extent intended: Cecum.  The cecum was identified by IC valve. Colon retroflexion performed. ASA Classification: II. Tolerance: good.  Monitoring: Pulse and BP monitoring, Oximetry used. Supplemental O2 given. at 2 Liters.  Colon Prep Used Miralax for colon prep. Prep results: good.  Sedation Meds: Patient assessed and found to be appropriate for moderate (conscious) sedation. Sedation was managed by the Endoscopist. Fentanyl 100 mcg. given IV. Versed 3 mg. given IV.  Findings NORMAL EXAM: Cecum.  PRIOR SURGERY: Sigmoid Colon. Segmental Colectomy.  NORMAL EXAM: Rectum. Comments: Injected mucosa, secondary to prep.   Assessment Normal examination.  Events  Unplanned Interventions: No intervention was required.  Unplanned Events: There were no complications. Plans  Post Exam Instructions: Post sedation instructions given.  Patient Education: Patient given standard instructions for: a normal exam.  Scheduling/Referral: Colonoscopy, to Molly Maduro D. Arlyce Dice, MD, around Dec 25, 2009.    This report was created from the original endoscopy report, which was reviewed and signed by the above listed endoscopist.    cc. Oren Bracket

## 2010-11-05 NOTE — Progress Notes (Signed)
Summary: refill meds/ question  Phone Note Refill Request Call back at Home Phone [phone] Message from:  Patient on Mar 05, 2010 2:50 PM  Refills Requested: Medication #1:  PRADAXA 150 MG CAPS take one capsule two times a day [facility] [address]    Method Requested: Fax to Local Pharmacy Initial call taken by: Lorne Skeens,  Mar 05, 2010 2:50 PM Reason for Call: Talk to Nurse Summary of Call: per pt calling also has question regarding med.   Follow-up for Phone Call        pt needs samples until she can get her order in.  Samples two boxes with a total of 24 capsules left out front her pt  lot [id number] A  exp Dec 11, pt aware Follow-up by: Charolotte Capuchin, RN,  Mar 05, 2010 3:41 PM    Prescriptions: PRADAXA 150 MG CAPS (DABIGATRAN ETEXILATE MESYLATE) take one capsule two times a day  #60 x 11   Entered by:   Charolotte Capuchin, RN   Authorized by:   Rollene Rotunda, MD, [facility]   Signed by:   Charolotte Capuchin, RN on 03/05/2010   Method used:   Electronically to        [facility]  [phone] (retail)       [address]       [place], [place]  [address]       Ph: [phone] or [phone]       Fax: [phone]   RxID:   [id number]

## 2010-12-19 LAB — POCT I-STAT, CHEM 8
BUN: 13 mg/dL (ref 6–23)
Calcium, Ion: 1.12 mmol/L (ref 1.12–1.32)
Chloride: 100 mEq/L (ref 96–112)
Creatinine, Ser: 0.7 mg/dL (ref 0.4–1.2)

## 2010-12-19 LAB — POCT CARDIAC MARKERS
CKMB, poc: 2.1 ng/mL (ref 1.0–8.0)
Troponin i, poc: <0.05 ng/mL (ref 0.00–0.09)

## 2011-01-12 LAB — CBC
HCT: 37.9 % (ref 36.0–46.0)
MCHC: 34.6 g/dL (ref 30.0–36.0)
MCV: 91.2 fL (ref 78.0–100.0)
Platelets: 203 10*3/uL (ref 150–400)
RBC: 4.16 MIL/uL (ref 3.87–5.11)
WBC: 7.8 10*3/uL (ref 4.0–10.5)

## 2011-01-12 LAB — POCT I-STAT, CHEM 8
Creatinine, Ser: 0.7 mg/dL (ref 0.4–1.2)
Glucose, Bld: 141 mg/dL — ABNORMAL HIGH (ref 70–99)
Hemoglobin: 12.6 g/dL (ref 12.0–15.0)
TCO2: 27 mmol/L (ref 0–100)

## 2011-01-12 LAB — DIFFERENTIAL
Eosinophils Absolute: 0 10*3/uL (ref 0.0–0.7)
Eosinophils Relative: 0 % (ref 0–5)
Lymphs Abs: 0.9 10*3/uL (ref 0.7–4.0)
Monocytes Relative: 9 % (ref 3–12)

## 2011-02-21 NOTE — Op Note (Signed)
NAME:  Nicole Hutchinson, Nicole Hutchinson                       ACCOUNT NO.:  [id number]   MEDICAL RECORD NO.:  [id number]                   PATIENT TYPE:  OIB   LOCATION:  2899                                 FACILITY:  [facility]   PHYSICIAN:  Robert L. Dione Booze, M.D.               DATE OF BIRTH:  1936-03-12   DATE OF PROCEDURE:  12/01/2002  DATE OF DISCHARGE:                                 OPERATIVE REPORT   PREOPERATIVE DIAGNOSIS:  Severe right ptosis.   POSTOPERATIVE DIAGNOSIS:  Severe right ptosis.   PROCEDURE:  Right ptosis repair.   ANESTHESIA:  1% Xylocaine with epinephrine.   SURGEON:  Robert L. Dione Booze, M.D.   INDICATIONS AND JUSTIFICATION FOR PROCEDURE:  This lady has been followed in  my office for about five years.  When first seen she had 0.3 optic nerve  cupping and definite cataracts and reduced vision somewhat from the  cataracts.  She had a right exotropia and a right ptosis.  She has been seen  over the years and had an uncomplicated right cataract extraction with an  implant performed in October 2003, followed by left cataract extraction in  December 2003.  She did extremely well and sees up to around 20/40, but the  ptosis on the right covers the pupil and makes refraction very difficult.  The plan is to repair the severe right ptosis before refracting the patient  for her final glasses, since this does blunt the upper field of vision.  Photographs were taken to demonstrate the dramatic problem and visual field  testing was done, but actually when the right lid is relaxed there is no  right field because the lid covers the pupil.  A recent examination on  07/13/02 before the cataract showed 20-50 to 20/60 vision, dropping to count  fingers with glare testing.  Pressures were 20 in each eye.  The pupils,  motility, lids, conjunctivae, cornea, anterior chamber, and fundus  examination do show a large exotropia and a severe right ptosis, and also  there is Fuch's endothelial dystrophy  bilaterally.  Initially there were  dense nuclear cataracts but now they have been removed, and she has lens  implants in place and is doing well.  She decided to have the right ptosis  repaired in order to improve her vision.  Medically she should be stable for  that.  She is followed medically by the [facility] and recently has been  seeing Sean A. Everardo All, M.D.   JUSTIFICATION FOR PERFORMING PROCEDURE IN AN OUTPATIENT SETTING:  Routine.   JUSTIFICATION FOR OVERNIGHT STAY:  None.   DESCRIPTION OF PROCEDURE:  The patient arrived in the minor surgery room at  [facility] and was prepped and draped in the routine fashion.  A  frontal lid block consisting of 1% Xylocaine with epinephrine was  administered.  The upper lid was everted and the tarsal margin was clamped  with two  curved hemostats for about 4 mm.  The tissue clamped was excised  after a 6-0 chromic gut suture had been run behind the hemostat.  After the  tissue was excised, the suture was run back to the starting point and tied  so that the knot was buried.  Polysporin ointment was used and the eye was  patched.  The patient left the minor room, having done well.    FOLLOW-UP CARE:  The patient is to remove the patch the next day.  She is to  use warm compresses and Polysporin ointment twice daily.  She is to be seen  in my office in several weeks but is to call if the eye is very irritated.                                               Robert L. Dione Booze, M.D.    RLG/MEDQ  D:  12/01/2002  T:  12/01/2002  Job:  [id number]   cc:   Gregary Signs A. Everardo All, M.D. [facility]

## 2011-02-21 NOTE — Discharge Summary (Signed)
[facility]  Patient:    Nicole Hutchinson, Nicole Hutchinson                 MRN: [id number] Adm. Date:  [id number] Disc. Date: [id number] Attending:  Arlis Porta CC:         Barbette Hair. Arlyce Dice, M.D. [facility] Darrelyn Hillock, M.D. 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  SECONDARY DIAGNOSIS:  Hypertension.  PROCEDURE:  Laparoscopic resection of rectosigmoid colon, December 24, 2000.  REASON FOR ADMISSION:  Ms. [name] is a 75 year old female who underwent a routine screening colonoscopy by Dr. Melvia Heaps and was found to have a very large area and the sigmoid colon was blanketed with multiple polyps. Some of these were biopsied and were tubular adenomas; however, because of its position, the polyps could not be completely removed and she was admitted for resection of this area.  HOSPITAL COURSE:  She underwent the above operation without complications. Postoperatively her antihypertensives were continued immediately. She progressed well without complications. She was passing flatus by her second postoperative day and was tolerating a full liquid diet by her fourth postoperative day with bowel movements. She was ambulatory and incisions were clean and intact and she was felt to be ready for discharge and discharged to home on December 28, 2000 in satisfactory condition. She will return to the office in three days for staple removal and then come back and see me on April 11. She was given activity restrictions and given Darvocet for pain and told to continue her home medications. DD:  01/12/01 TD:  01/12/01 Job: [id number]

## 2011-02-21 NOTE — Op Note (Signed)
[facility]  Patient:    Nicole Hutchinson, Nicole Hutchinson                 MRN: [id number] Proc. Date: 12/24/00 Adm. Date:  [id number] Attending:  Arlis Porta CC:         Justine Null, M.D. [facility]  Molly Maduro D. Arlyce Dice, M.D. [facility]   Operative Report  PREOPERATIVE DIAGNOSIS:  Large adenomatous polyp of the distal sigmoid colon.  POSTOPERATIVE DIAGNOSIS:  Large villous adenoma of the distal sigmoid colon.  PROCEDURE:  Laparoscopic resection of the rectosigmoid colon.  SURGEON:  Adolph Pollack, M.D.  Threasa HeadsGerrit Friends.  ANESTHESIA:  General.  INDICATION:  [name] is a 75 year old female who underwent routine screening colonoscopy by Dr. Arlyce Dice and was found to have a very large area in the sigmoid colon that was blanketed with multiple polyps.  Some of these were biopsied and were tubular adenomas.  They were unable to be removed completely by way of colonoscopy, and now she presents for elective resection of this area.  TECHNIQUE:  She was placed supine on the operating table, and a general anesthetic was administered.  She was then placed in the lithotomy position. A proctosigmoidoscopy was performed and identified the distal end of the polyp at 14 cm, and I injected some [place] ink into the mucosa of the colon at this point.  I then removed the air sigmoidoscopy.  The abdomen and perineum and perianal area were then sterilely prepped and draped.  A 1.5 cm incision was made in the supraumbilical skin, incising the skin sharply.  The midline fascia was identified and a small incision made in the midline fascia.  The peritoneal cavity was entered bluntly and under direct vision.  A posterior suture of 0 Vicryl was placed around the fascial edges.  A Hasson trocar was introduced into the peritoneal cavity and pneumoperitoneum created by insufflation of CO2 gas.  Next, the laparoscope was introduced and under direct vision, a 5 mm trocar was  placed in the left lower quadrant.  A 12 mm trocar was placed in the right lower quadrant.  We then placed the patient in steep Trendelenburg position. I began by manipulating the pelvis.  I then retracted the sigmoid colon superiorly and noticed the marking in the distal sigmoid colon.  I added a second 5 mm trocar in the lower midline.  Next, I began incising the lateral and medial peritoneal attachments of the sigmoid colon.  I identified the left ureter and left it posterior and lateral to the area of dissection.  I subsequently identified the peritoneal reflection and incised the peritoneum lower colon at this point.  Next, I removed the 5 mm trocar from the left lower quadrant, enlarged the incision, and used the hand-assist device that allowed me to place my left hand into the peritoneal cavity.  I did some blunt dissection with my hand and was able to guide posterior dissection by dividing the posterior attachments to the sacral hollow with the harmonic scalpel.  I continued to divide lateral attachments as well until I was at least 3 cm below the ink marking.  I then cleaned the pericolonic fatty tissue away from this area, and then the colon was divided with multiple firings of the Endo GIA stapler.  I then released the pneumoperitoneum and then pulled the proximal rectal end out of the left lower quadrant incision.  I divided more of the mesentery and then resected the small segment of the  sigmoid colon.  I opened this up on the back table and noted there was a large 3-3.5 cm area of villous-type adenoma.  Next, a pursestring suture was placed around the sigmoid stump, and a size 31 anvil introduced and then anchored by tying down the pursestring suture.  I then placed this back in the abdominal cavity and reinsufflated the abdomen. I placed my left hand back through the protractor hand-assist device.  The rectum was dilated, and the handle of a size 31 EEA was was inserted.   The spike came out above the previous staple line in the rectal stump.  Next, a circular stapled anastomosis was performed.  The area just proximal to this staple line was occluded, and air was insufflated into the rectum.  Fluid was used to fill the pelvis.  There was no evidence of leak.  The staple line appeared healthy on the proctosigmoidoscopy.  Next, the area was copiously irrigated, and fluid was evacuated.  Hemostasis appeared adequate.  I subsequently released the pneumoperitoneum and removed the trocars.  The posterior and anterior fascial layers of the left lower quadrant incision were closed with a running #1 PDS suture.  The upper gastric fascial defect was closed by tying down the pursestring suture.  The skin incision was then closed with staples.  She tolerated the procedure well without any apparent complications and was taken to the recovery room in satisfactory condition. DD:  12/24/00 TD:  12/25/00 Job: [id number]

## 2011-03-10 ENCOUNTER — Other Ambulatory Visit: Payer: Self-pay | Admitting: Cardiology

## 2011-03-17 ENCOUNTER — Encounter: Payer: Self-pay | Admitting: Internal Medicine

## 2011-05-05 ENCOUNTER — Telehealth: Payer: Self-pay | Admitting: Internal Medicine

## 2011-05-05 NOTE — Telephone Encounter (Signed)
I spoke with the patient and advised her if we need to do labs, they should not require her to be fasting.

## 2011-05-05 NOTE — Telephone Encounter (Signed)
Per pt call, pt has appt w/ Dr. Graciela Husbands this Wednesday and pt wants to know if she needs to have blood work and/or if she can eat breakfast. Please return pt call to advise/discuss.

## 2011-05-07 ENCOUNTER — Encounter: Payer: Self-pay | Admitting: Internal Medicine

## 2011-05-07 ENCOUNTER — Ambulatory Visit (INDEPENDENT_AMBULATORY_CARE_PROVIDER_SITE_OTHER): Payer: Medicare Other | Admitting: Internal Medicine

## 2011-05-07 DIAGNOSIS — I498 Other specified cardiac arrhythmias: Secondary | ICD-10-CM

## 2011-05-07 DIAGNOSIS — I4891 Unspecified atrial fibrillation: Secondary | ICD-10-CM

## 2011-05-07 DIAGNOSIS — I10 Essential (primary) hypertension: Secondary | ICD-10-CM

## 2011-05-07 NOTE — Progress Notes (Signed)
  HPI  Nicole Hutchinson is a 75 y.o. female Seen in followup because of atrial fibrillation and bradycardia. She has mitral regurgitation with moderate left atrial enlargement by echo in the fall with a dimension of 48 mm. Her thromboembolic risk factors include hypertension gender age and prior thromboembolic episode demonstrated a MRI scanning. She was started on Pradaxa and she has tolerated this well.   She has done quite well. She notes some shortness of breath when she goes out in the heat. She does not notice similar symptoms walking in grocery stores or the malls etc. She's had no significant peripheral edema. There is no accompanying chest pain.    Past Medical History  Diagnosis Date  . Moderate mitral regurgitation by prior echocardiogram 2008  . HTN (hypertension)   . Hyperthyroidism   . Osteoarthritis   . Osteoporosis   . Tubulovillous adenoma of colon 2002  . Dyslipidemia   . Hyperglycemia     Past Surgical History  Procedure Date  . Abdominal hysterectomy   . Right hemi-thyroidectomy   . Stress cardiolite 09/20/2003  . Dexa 12/29/2005  . Colon resection     Current Outpatient Prescriptions  Medication Sig Dispense Refill  . latanoprost (XALATAN) 0.005 % ophthalmic solution As directed       . losartan-hydrochlorothiazide (HYZAAR) 50-12.5 MG per tablet Take 1 tablet by mouth daily.        Marland Kitchen lovastatin (MEVACOR) 40 MG tablet Take 80 mg by mouth at bedtime.        Marland Kitchen PRADAXA 150 MG CAPS TAKE ONE CAPSULE BY MOUTH TWICE DAILY  60 capsule  6    Allergies  Allergen Reactions  . Ace Inhibitors     REACTION: cough  . Codeine     Review of Systems negative except from HPI and PMH  Physical Exam Well developed and well nourished in no acute distress HEENT normal Neck Supple Clear to ausculation Regular rate and rhythm, no murmurs gallops or rub Soft with active bowel sounds No clubbing cyanosis ; trace edema Alert and oriented, grossly normal motor and sensory  function Skin Warm and Dry  ECG A relatively regular rhythm with a QRS duration of 90 ms. There are no discernible P waves.  Assessment and  Plan

## 2011-05-07 NOTE — Assessment & Plan Note (Signed)
This is newly identified. The fact that she has some dyspnea makes me concerned. However the fact that she is able to ambulate inside instead of out I'm not sure that this represents a rhythm issue. In the event that her dyspnea worsens or that her symptoms do not resolve with the change in weather, I suggested we consider treadmill testing

## 2011-05-07 NOTE — Patient Instructions (Signed)
Your physician wants you to follow-up in: 1 year  You will receive a reminder letter in the mail two months in advance. If you don't receive a letter, please call our office to schedule the follow-up appointment.  Your physician recommends that you continue on your current medications as directed. Please refer to the Current Medication list given to you today.  

## 2011-05-07 NOTE — Assessment & Plan Note (Signed)
Currently in a junctional rhythm as best as I can tell. She is on Pradaxa. She is tolerated it well without GI side effects.

## 2011-05-07 NOTE — Assessment & Plan Note (Signed)
Blood pressure is well controlled 

## 2011-06-04 ENCOUNTER — Other Ambulatory Visit: Payer: Self-pay | Admitting: Endocrinology

## 2011-07-01 ENCOUNTER — Other Ambulatory Visit (INDEPENDENT_AMBULATORY_CARE_PROVIDER_SITE_OTHER): Payer: Medicare Other

## 2011-07-01 ENCOUNTER — Encounter: Payer: Self-pay | Admitting: Endocrinology

## 2011-07-01 ENCOUNTER — Ambulatory Visit (INDEPENDENT_AMBULATORY_CARE_PROVIDER_SITE_OTHER): Payer: Medicare Other | Admitting: Endocrinology

## 2011-07-01 VITALS — BP 126/72 | HR 58 | Temp 97.9°F | Wt 110.6 lb

## 2011-07-01 DIAGNOSIS — R7309 Other abnormal glucose: Secondary | ICD-10-CM

## 2011-07-01 DIAGNOSIS — M810 Age-related osteoporosis without current pathological fracture: Secondary | ICD-10-CM

## 2011-07-01 DIAGNOSIS — R609 Edema, unspecified: Secondary | ICD-10-CM

## 2011-07-01 DIAGNOSIS — Z79899 Other long term (current) drug therapy: Secondary | ICD-10-CM

## 2011-07-01 DIAGNOSIS — E7800 Pure hypercholesterolemia, unspecified: Secondary | ICD-10-CM

## 2011-07-01 DIAGNOSIS — Z860100 Personal history of colon polyps, unspecified: Secondary | ICD-10-CM

## 2011-07-01 DIAGNOSIS — Z0000 Encounter for general adult medical examination without abnormal findings: Secondary | ICD-10-CM

## 2011-07-01 DIAGNOSIS — I4891 Unspecified atrial fibrillation: Secondary | ICD-10-CM

## 2011-07-01 DIAGNOSIS — Z23 Encounter for immunization: Secondary | ICD-10-CM

## 2011-07-01 LAB — CBC WITH DIFFERENTIAL/PLATELET
Basophils Relative: 0.5 % (ref 0.0–3.0)
Eosinophils Absolute: 0 10*3/uL (ref 0.0–0.7)
Eosinophils Relative: 0.3 % (ref 0.0–5.0)
Lymphocytes Relative: 12.7 % (ref 12.0–46.0)
MCHC: 33.9 g/dL (ref 30.0–36.0)
Neutrophils Relative %: 77.1 % — ABNORMAL HIGH (ref 43.0–77.0)
RBC: 4.31 Mil/uL (ref 3.87–5.11)
WBC: 6.7 10*3/uL (ref 4.5–10.5)

## 2011-07-01 LAB — BASIC METABOLIC PANEL
BUN: 16 mg/dL (ref 6–23)
Chloride: 103 mEq/L (ref 96–112)
GFR: 78 mL/min (ref >60.00)
Glucose, Bld: 90 mg/dL (ref 70–99)
Potassium: 4 mEq/L (ref 3.5–5.1)

## 2011-07-01 LAB — URINALYSIS, ROUTINE W REFLEX MICROSCOPIC
Specific Gravity, Urine: 1.01 (ref 1.000–1.030)
Urine Glucose: NEGATIVE
Urobilinogen, UA: 0.2 (ref 0.0–1.0)

## 2011-07-01 LAB — HEPATIC FUNCTION PANEL
ALT: 21 U/L (ref 0–35)
Albumin: 4.1 g/dL (ref 3.5–5.2)
Total Protein: 6.8 g/dL (ref 6.0–8.3)

## 2011-07-01 LAB — LIPID PANEL
Cholesterol: 141 mg/dL (ref 0–200)
VLDL: 6.8 mg/dL (ref 0.0–40.0)

## 2011-07-01 MED ORDER — FLUTICASONE PROPIONATE 50 MCG/ACT NA SUSP: 2.0000 | Freq: Every day | NASAL | Status: DC

## 2011-07-01 NOTE — Progress Notes (Signed)
Subjective:    Patient ID: Nicole Hutchinson, female    DOB: [birth year], 75 y.o.   MRN: [id number]  HPI Subjective:   Patient here for Medicare annual wellness visit and management of other chronic and acute problems.     Risk factors: advanced age    Roster of Physicians Providing Medical Care to Patient: Cardiol: klein Opthal: groat Gyn: mcphail   Activities of Daily Living: In your present state of health, do you have any difficulty performing the following activities?:  Preparing food and eating?: No  Bathing yourself: No  Getting dressed: No  Using the toilet:No  Moving around from place to place: No  In the past year have you fallen or had a near fall?: No    Home Safety: Has smoke detector and wears seat belts. No firearms. No excess sun exposure.  Diet and Exercise  Current exercise habits: pt says not very good Dietary issues discussed: healthy diet   Depression Screen  Q1: Over the past two weeks, have you felt down, depressed or hopeless? no  Q2: Over the past two weeks, have you felt little interest or pleasure in doing things? no   The following portions of the patient's history were reviewed and updated as appropriate: allergies, current medications, past family history, past medical history, past social history, past surgical history and problem list.   Review of Systems  Denies visual loss.  She has hearing aids Objective:   Vision:  Sees opthalmologist Hearing: grossly normal Body mass index:  See vs page Msk: pt easily and quickly performs "get-up-and-go" from a sitting position. Cognitive Impairment Assessment: cognition, memory and judgment appear normal.  remembers 3/3 at 5 minutes.  excellent recall.  can easily read and write a sentence.  alert and oriented x 3   Assessment:   Medicare wellness utd on preventive parameters    Plan:   During the course of the visit the patient was educated and counseled about appropriate screening and  preventive services including:        Fall prevention   Screening mammography: utd Bone densitometry screening: utd Diabetes screening  Nutrition counseling   Vaccines / LABS Zostavax / Pnemonccoal Vaccine  today  PSA  Patient Instructions (the written plan) was given to the patient.        Review of Systems     Objective:   Physical Exam        Assessment & Plan:     SEPARATE EVALUATION FOLLOWS-- HISTORY OF THE PRESENT ILLNESS: Pt states a few mos of slight congestion in the nose, and assoc rhinorrhea PAST MEDICAL HISTORY reviewed and up to date today REVIEW OF SYSTEMS: Denies weight change and brbpr. PHYSICAL EXAMINATION: VITAL SIGNS:  See vs page GENERAL: no distress GENERAL: no distress head: no deformity eyes: no periorbital swelling, no proptosis external nose and ears are normal mouth: no lesion seen Both eac's and tm's are normal Ext: trace if any edema LAB/XRAY RESULTS: Lab Results  Component Value Date   WBC 6.7 07/01/2011   HGB 13.5 07/01/2011   HCT 39.7 07/01/2011   PLT 211.0 07/01/2011   CHOL 141 07/01/2011   TRIG 34.0 07/01/2011   HDL 81.00 07/01/2011   ALT 21 07/01/2011   AST 27 07/01/2011   NA 144 07/01/2011   K 4.0 07/01/2011   CL 103 07/01/2011   CREATININE 0.8 07/01/2011   BUN 16 07/01/2011   CO2 30 07/01/2011   TSH 2.63 07/01/2011   HGBA1C 5.8 07/01/2011  IMPRESSION: Allergic rhinitis, new Hyperglycemia, stable Dyslipidemia, well-controlled PLAN: See instruction page

## 2011-07-01 NOTE — Patient Instructions (Addendum)
Refer to a gynecology specialist.  you will receive a phone call, about a day and time for an appointment please consider these measures for your health:  minimize alcohol.  do not use tobacco products.  have a colonoscopy at least every 10 years from age 73.  Women should have an annual mammogram from age 68.  keep firearms safely stored.  always use seat belts.  have working smoke alarms in your home.  see an eye doctor and dentist regularly.  never drive under the influence of alcohol or drugs (including prescription drugs).  those with fair skin should take precautions against the sun. please let me know what your wishes would be, if artificial life support measures should become necessary.  it is critically important to prevent falling down (keep floor areas well-lit, dry, and free of loose objects.  Also, try not to rush) Please return in 1 year blood tests are being requested for you today.  please call [phone] to hear your test results.  You will be prompted to enter the 9-digit "MRN" number that appears at the top left of this page, followed by #.  Then you will hear the message. i have sent a prescription to your pharmacy, for an anti-allergy nasal spray.  You should have a vaccine against shingles (a painful rash which results from the  chickenpox infection which most people had many years ago).  This vaccine reduces, but does not totally eliminate the risk of shingles.  Because this is a medicare part d benefit, there are 3 ways you can get it:  You can go to a pharmacy and get the injection (I can give you a prescription), or I can give you a prescription to have filled at a pharmacy, and bring back here for [place] to give.  The other option is that you can pay up-front for it, and we'll give you a form to make a claim for reimbursement from your medicare part d carrier.

## 2011-07-02 LAB — PTH, INTACT AND CALCIUM: Calcium, Total (PTH): 9.5 mg/dL (ref 8.4–10.5)

## 2011-09-03 ENCOUNTER — Other Ambulatory Visit: Payer: Self-pay | Admitting: Cardiology

## 2011-09-03 MED ORDER — DABIGATRAN ETEXILATE MESYLATE 150 MG PO CAPS: 150.0000 mg | ORAL_CAPSULE | Freq: Two times a day (BID) | ORAL | Status: DC

## 2011-09-17 ENCOUNTER — Other Ambulatory Visit: Payer: Self-pay | Admitting: Obstetrics and Gynecology

## 2011-09-17 DIAGNOSIS — Z1231 Encounter for screening mammogram for malignant neoplasm of breast: Secondary | ICD-10-CM

## 2011-10-15 ENCOUNTER — Ambulatory Visit
Admission: RE | Admit: 2011-10-15 | Discharge: 2011-10-15 | Disposition: A | Discharge status: Home / Self Care | Payer: Medicare Other | Source: Ambulatory Visit | Attending: Obstetrics and Gynecology | Admitting: Obstetrics and Gynecology

## 2011-10-15 DIAGNOSIS — Z1231 Encounter for screening mammogram for malignant neoplasm of breast: Secondary | ICD-10-CM

## 2011-10-21 ENCOUNTER — Other Ambulatory Visit: Payer: Self-pay | Admitting: Obstetrics and Gynecology

## 2011-10-21 DIAGNOSIS — R928 Other abnormal and inconclusive findings on diagnostic imaging of breast: Secondary | ICD-10-CM

## 2011-10-23 ENCOUNTER — Ambulatory Visit: Payer: Medicare Other

## 2011-10-24 ENCOUNTER — Ambulatory Visit
Admission: RE | Admit: 2011-10-24 | Discharge: 2011-10-24 | Disposition: A | Discharge status: Home / Self Care | Payer: Medicare Other | Source: Ambulatory Visit | Attending: Obstetrics and Gynecology | Admitting: Obstetrics and Gynecology

## 2011-10-24 DIAGNOSIS — R928 Other abnormal and inconclusive findings on diagnostic imaging of breast: Secondary | ICD-10-CM

## 2011-10-28 ENCOUNTER — Other Ambulatory Visit: Payer: Medicare Other

## 2011-12-03 ENCOUNTER — Other Ambulatory Visit: Payer: Self-pay | Admitting: Endocrinology

## 2012-04-19 ENCOUNTER — Other Ambulatory Visit: Payer: Self-pay | Admitting: Cardiology

## 2012-05-25 ENCOUNTER — Ambulatory Visit (INDEPENDENT_AMBULATORY_CARE_PROVIDER_SITE_OTHER): Payer: Medicare Other | Admitting: Internal Medicine

## 2012-05-25 ENCOUNTER — Encounter: Payer: Self-pay | Admitting: Internal Medicine

## 2012-05-25 ENCOUNTER — Telehealth: Payer: Self-pay | Admitting: *Deleted

## 2012-05-25 VITALS — BP 147/72 | HR 61 | Ht <= 58 in | Wt 110.8 lb

## 2012-05-25 DIAGNOSIS — I498 Other specified cardiac arrhythmias: Secondary | ICD-10-CM

## 2012-05-25 DIAGNOSIS — I4891 Unspecified atrial fibrillation: Secondary | ICD-10-CM

## 2012-05-25 NOTE — Progress Notes (Signed)
  HPI  Nicole Hutchinson is a 76 y.o. female  Seen in followup because of atrial fibrillation and bradycardia. She has mitral regurgitation with moderate left atrial enlargement by echo in the fall with a dimension of 48 mm. Her thromboembolic risk factors include hypertension gender age and prior thromboembolic episode demonstrated by  MRI scanning.    Anticoagulation is with pradAXA ; renal function was normal September 2012. She is to see Dr. Everardo All next month for her annual physical and we will defer reassessment of her kidney function until that time  Past Medical History  Diagnosis Date  . Atrial fibrillation 2008    On Pradaxa  . HTN (hypertension)   . Hyperthyroidism   . Osteoarthritis   . Osteoporosis   . Tubulovillous adenoma of colon 2002  . Dyslipidemia   . Hyperglycemia   . Moderate mitral regurgitation by prior echocardiogram     Normal left ventricular function left atrial enlargement echo 2010    Past Surgical History  Procedure Date  . Abdominal hysterectomy   . Right hemi-thyroidectomy   . Stress cardiolite 09/20/2003  . Dexa 12/29/2005  . Colon resection     Current Outpatient Prescriptions  Medication Sig Dispense Refill  . losartan-hydrochlorothiazide (HYZAAR) 50-12.5 MG per tablet TAKE ONE TABLET BY MOUTH EVERY DAY  90 tablet  1  . PRADAXA 150 MG CAPS TAKE ONE CAPSULE BY MOUTH EVERY 12 HOURS  60 capsule  9  . latanoprost (XALATAN) 0.005 % ophthalmic solution As directed         Allergies  Allergen Reactions  . Ace Inhibitors     REACTION: cough  . Codeine     Review of Systems negative except from HPI and PMH  Physical Exam BP 147/72  Pulse 61  Ht 4' 7.2" (1.402 m)  Wt 110 lb 12.8 oz (50.259 kg)  BMI 25.57 kg/m2 Well developed and well nourished in no acute distress HENT normal E scleral and icterus clear Neck Supple JVP flat; carotids brisk and full Clear to ausculation Regular rate and rhythm, no murmurs gallops or rub Soft with  active bowel sounds No clubbing cyanosis 1+ Edema Alert and oriented, grossly normal motor and sensory function Skin Warm and Dry  Electrocardiogram today demonstrates what is probably sinus bradycardia with competing junctional rhythm  Assessment and  Plan

## 2012-05-25 NOTE — Assessment & Plan Note (Signed)
JUNCTIONAL WITH SINUS NODE

## 2012-05-25 NOTE — Assessment & Plan Note (Signed)
STABLE on Pradaxa. We will follow her renal function

## 2012-05-25 NOTE — Patient Instructions (Signed)
Your physician wants you to follow-up in: 1 year with Dr Klein.  You will receive a reminder letter in the mail two months in advance. If you don't receive a letter, please call our office to schedule the follow-up appointment.  

## 2012-05-25 NOTE — Telephone Encounter (Signed)
The patient was seen for an office visit earlier today. She called back because she gave the wrong medication list. She is on Pradaxa 150 mg BID, Losartan/HCT 50/12.5 mg once daily, lovastatin 40 mg two tablets daily, & latanoprost eye gtt- 0.005% one drop to each eye at bedtime. I will update her med list from here as her office visit is close.

## 2012-05-31 ENCOUNTER — Other Ambulatory Visit: Payer: Self-pay | Admitting: Endocrinology

## 2012-06-01 ENCOUNTER — Telehealth: Payer: Self-pay | Admitting: Internal Medicine

## 2012-06-01 NOTE — Telephone Encounter (Signed)
PT CALLING RE MED LIST IS INCORRECT

## 2012-06-01 NOTE — Telephone Encounter (Signed)
Patient called stated she needed to add eye gtts to her medication list.States she takes latanoprost 0.0005 % 1 gtt to each eye daily.

## 2012-06-17 ENCOUNTER — Other Ambulatory Visit: Payer: Self-pay | Admitting: Cardiology

## 2012-06-17 DIAGNOSIS — I6529 Occlusion and stenosis of unspecified carotid artery: Secondary | ICD-10-CM

## 2012-06-21 DIAGNOSIS — I6529 Occlusion and stenosis of unspecified carotid artery: Secondary | ICD-10-CM

## 2012-06-23 ENCOUNTER — Encounter: Payer: Self-pay | Admitting: Endocrinology

## 2012-07-08 ENCOUNTER — Ambulatory Visit (INDEPENDENT_AMBULATORY_CARE_PROVIDER_SITE_OTHER)
Admission: RE | Admit: 2012-07-08 | Discharge: 2012-07-08 | Disposition: A | Discharge status: Home / Self Care | Payer: Medicare Other | Source: Ambulatory Visit

## 2012-07-08 ENCOUNTER — Ambulatory Visit (INDEPENDENT_AMBULATORY_CARE_PROVIDER_SITE_OTHER): Payer: Medicare Other | Admitting: Endocrinology

## 2012-07-08 ENCOUNTER — Encounter: Payer: Self-pay | Admitting: Endocrinology

## 2012-07-08 VITALS — BP 122/80 | HR 76 | Temp 98.4°F | Wt 107.0 lb

## 2012-07-08 DIAGNOSIS — M810 Age-related osteoporosis without current pathological fracture: Secondary | ICD-10-CM

## 2012-07-08 DIAGNOSIS — E7800 Pure hypercholesterolemia, unspecified: Secondary | ICD-10-CM

## 2012-07-08 DIAGNOSIS — Z0000 Encounter for general adult medical examination without abnormal findings: Secondary | ICD-10-CM

## 2012-07-08 DIAGNOSIS — R7309 Other abnormal glucose: Secondary | ICD-10-CM

## 2012-07-08 DIAGNOSIS — Z79899 Other long term (current) drug therapy: Secondary | ICD-10-CM

## 2012-07-08 DIAGNOSIS — IMO0002 Reserved for concepts with insufficient information to code with codable children: Secondary | ICD-10-CM

## 2012-07-08 DIAGNOSIS — I4891 Unspecified atrial fibrillation: Secondary | ICD-10-CM

## 2012-07-08 DIAGNOSIS — I10 Essential (primary) hypertension: Secondary | ICD-10-CM

## 2012-07-08 LAB — LIPID PANEL
LDL Cholesterol: 59 mg/dL (ref 0–99)
Triglycerides: 39 mg/dL (ref <150)

## 2012-07-08 LAB — CBC WITH DIFFERENTIAL/PLATELET
Basophils Absolute: 0 10*3/uL (ref 0.0–0.1)
Eosinophils Relative: 0 % (ref 0–5)
Lymphocytes Relative: 17 % (ref 12–46)
MCV: 89.5 fL (ref 78.0–100.0)
Neutro Abs: 3.8 10*3/uL (ref 1.7–7.7)
Platelets: 205 10*3/uL (ref 150–400)
RDW: 13.9 % (ref 11.5–15.5)
WBC: 5.3 10*3/uL (ref 4.0–10.5)

## 2012-07-08 LAB — BASIC METABOLIC PANEL
CO2: 27 mEq/L (ref 19–32)
Chloride: 105 mEq/L (ref 96–112)
Creat: 0.69 mg/dL (ref 0.50–1.10)
Potassium: 4.4 mEq/L (ref 3.5–5.3)
Sodium: 143 mEq/L (ref 135–145)

## 2012-07-08 LAB — HEPATIC FUNCTION PANEL
Albumin: 4.3 g/dL (ref 3.5–5.2)
Total Protein: 6.1 g/dL (ref 6.0–8.3)

## 2012-07-08 LAB — TSH: TSH: 2.131 u[IU]/mL (ref 0.350–4.500)

## 2012-07-08 MED ORDER — ZOSTER VACCINE LIVE 19400 UNT/0.65ML ~~LOC~~ SOLR: 0.6500 mL | Freq: Once | SUBCUTANEOUS | Status: DC

## 2012-07-08 NOTE — Patient Instructions (Addendum)
please consider these measures for your health:  minimize alcohol.  do not use tobacco products.  have a colonoscopy at least every 10 years from age 74.  Women should have an annual mammogram from age 34.  keep firearms safely stored.  always use seat belts.  have working smoke alarms in your home.  see an eye doctor and dentist regularly.  never drive under the influence of alcohol or drugs (including prescription drugs).  those with fair skin should take precautions against the sun.  blood tests are being requested for you today.  You will be contacted with results.  please let me know what your wishes would be, if artificial life support measures should become necessary.  it is critically important to prevent falling down (keep floor areas well-lit, dry, and free of loose objects.  If you have a cane, walker, or wheelchair, you should use it, even for short trips around the house.  Also, try not to rush). Please return in 1 year.

## 2012-07-08 NOTE — Progress Notes (Signed)
Subjective:    Patient ID: Nicole Hutchinson, female    DOB: [birth year], 76 y.o.   MRN: [id number]  HPI The state of at least three ongoing medical problems is addressed today: HTN: she takes and tolerates hyzaar, as rx'ed Dyslipidemia: denies chest pain Hyperglycemia: denies weight change Past Medical History  Diagnosis Date  . Atrial fibrillation 2008    On Pradaxa  . HTN (hypertension)   . Hyperthyroidism   . Osteoarthritis   . Osteoporosis   . Tubulovillous adenoma of colon 2002  . Dyslipidemia   . Hyperglycemia   . Moderate mitral regurgitation by prior echocardiogram     Normal left ventricular function left atrial enlargement echo 2010    Past Surgical History  Procedure Date  . Abdominal hysterectomy   . Right hemi-thyroidectomy   . Stress cardiolite 09/20/2003  . Dexa 12/29/2005  . Colon resection     History   Social History  . Marital Status: Single    Spouse Name: N/A    Number of Children: N/A  . Years of Education: N/A   Occupational History  . Retired    Social History Main Topics  . Smoking status: Never Smoker   . Smokeless tobacco: Not on file  . Alcohol Use: No  . Drug Use: No  . Sexually Active: Not on file   Other Topics Concern  . Not on file   Social History Narrative  . No narrative on file    Current Outpatient Prescriptions on File Prior to Visit  Medication Sig Dispense Refill  . latanoprost (XALATAN) 0.005 % ophthalmic solution Place 1 drop into both eyes at bedtime.  2.5 mL  12  . losartan-hydrochlorothiazide (HYZAAR) 50-12.5 MG per tablet TAKE ONE TABLET BY MOUTH EVERY DAY  90 tablet  0  . lovastatin (MEVACOR) 40 MG tablet TAKE TWO TABLETS BY MOUTH AT BEDTIME  180 tablet  0  . PRADAXA 150 MG CAPS TAKE ONE CAPSULE BY MOUTH EVERY 12 HOURS  60 capsule  9    Allergies  Allergen Reactions  . Ace Inhibitors     REACTION: cough  . Codeine     Family History  Problem Relation Age of Onset  . Cancer Neg Hx     BP 122/80   Pulse 76  Temp 98.4 F (36.9 C) (Oral)  Wt 107 lb (48.535 kg)  Review of Systems Denies sob, numbness, and dizziness    Objective:   Physical Exam VS: see vs page GEN: no distress HEAD: head: no deformity eyes: no periorbital swelling, no proptosis external nose and ears are normal mouth: no lesion seen NECK: supple, thyroid is not enlarged CHEST WALL: no deformity LUNGS:  Clear to auscultation CV: reg rate and rhythm, no murmur ABD: abdomen is soft, nontender.  no hepatosplenomegaly.  not distended.  no hernia MUSCULOSKELETAL: muscle bulk and strength are grossly normal.  no obvious joint swelling.  gait is normal and steady EXTEMITIES: no deformity.  no ulcer on the feet.  feet are of normal color and temp.  no edema PULSES: dorsalis pedis intact bilat.  no carotid bruit NEURO:  cn 2-12 grossly intact.   readily moves all 4's.  sensation is intact to touch on the feet SKIN:  Normal texture and temperature.  No rash or suspicious lesion is visible.   NODES:  None palpable at the neck PSYCH: alert, oriented x3.  Does not appear anxious nor depressed.     Lab Results  Component Value Date  WBC 5.3 07/08/2012   HGB 12.8 07/08/2012   HCT 37.7 07/08/2012   PLT 205 07/08/2012   GLUCOSE 85 07/08/2012   CHOL 132 07/08/2012   TRIG 39 07/08/2012   HDL 65 07/08/2012   LDLCALC 59 07/08/2012   ALT 18 07/08/2012   AST 27 07/08/2012   NA 143 07/08/2012   K 4.4 07/08/2012   CL 105 07/08/2012   CREATININE 0.69 07/08/2012   BUN 14 07/08/2012   CO2 27 07/08/2012   TSH 2.131 07/08/2012   HGBA1C 5.8* 07/08/2012      Assessment & Plan:  Dyslipidemia, well-controlled Hyperglycemia, stable HTN, well-controlled.   Subjective:   Patient here for Medicare annual wellness visit and management of other chronic and acute problems.     Risk factors: advanced age    Roster of Physicians Providing Medical Care to Patient:  See "snapshot"   Activities of Daily Living: In your present state of  health, do you have any difficulty performing the following activities?:  Preparing food and eating?: No  Bathing yourself: No  Getting dressed: No  Using the toilet:No  Moving around from place to place: No  In the past year have you fallen or had a near fall?: No    Home Safety: Has smoke detector and wears seat belts. No firearms. No excess sun exposure.  Diet and Exercise  Current exercise habits: pt says not good Dietary issues discussed: pt reports a healthy diet   Depression Screen  Q1: Over the past two weeks, have you felt down, depressed or hopeless? no  Q2: Over the past two weeks, have you felt little interest or pleasure in doing things? no   The following portions of the patient's history were reviewed and updated as appropriate: allergies, current medications, past family history, past medical history, past social history, past surgical history and problem list.  Past Medical History  Diagnosis Date  . Atrial fibrillation 2008    On Pradaxa  . HTN (hypertension)   . Hyperthyroidism   . Osteoarthritis   . Osteoporosis   . Tubulovillous adenoma of colon 2002  . Dyslipidemia   . Hyperglycemia   . Moderate mitral regurgitation by prior echocardiogram     Normal left ventricular function left atrial enlargement echo 2010    Past Surgical History  Procedure Date  . Abdominal hysterectomy   . Right hemi-thyroidectomy   . Stress cardiolite 09/20/2003  . Dexa 12/29/2005  . Colon resection     History   Social History  . Marital Status: Single    Spouse Name: N/A    Number of Children: N/A  . Years of Education: N/A   Occupational History  . Retired    Social History Main Topics  . Smoking status: Never Smoker   . Smokeless tobacco: Not on file  . Alcohol Use: No  . Drug Use: No  . Sexually Active: Not on file   Other Topics Concern  . Not on file   Social History Narrative  . No narrative on file    Current Outpatient Prescriptions on File Prior  to Visit  Medication Sig Dispense Refill  . latanoprost (XALATAN) 0.005 % ophthalmic solution Place 1 drop into both eyes at bedtime.  2.5 mL  12  . losartan-hydrochlorothiazide (HYZAAR) 50-12.5 MG per tablet TAKE ONE TABLET BY MOUTH EVERY DAY  90 tablet  0  . lovastatin (MEVACOR) 40 MG tablet TAKE TWO TABLETS BY MOUTH AT BEDTIME  180 tablet  0  .  PRADAXA 150 MG CAPS TAKE ONE CAPSULE BY MOUTH EVERY 12 HOURS  60 capsule  9    Allergies  Allergen Reactions  . Ace Inhibitors     REACTION: cough  . Codeine     Family History  Problem Relation Age of Onset  . Cancer Neg Hx     BP 122/80  Pulse 76  Temp 98.4 F (36.9 C) (Oral)  Wt 107 lb (48.535 kg)   Review of Systems  No change in chronic hearing loss; denies visual loss Objective:   Vision:  Sees opthalmologist Hearing: grossly normal Body mass index:  See vs page Msk: pt easily and quickly performs "get-up-and-go" from a sitting position Cognitive Impairment Assessment: cognition, memory and judgment appear normal.  remembers 3/3 at 5 minutes.  excellent recall.  can easily read and write a sentence.  alert and oriented x 3.      Assessment:   Medicare wellness utd on preventive parameters    Plan:   During the course of the visit the patient was educated and counseled about appropriate screening and preventive services including:       Fall prevention   Screening mammography  Bone densitometry screening  Diabetes screening  Nutrition counseling   Vaccines / LABS Zostavax / Pnemonccoal Vaccine  today   Patient Instructions (the written plan) was given to the patient.

## 2012-07-09 LAB — PTH, INTACT AND CALCIUM
Calcium, Total (PTH): 9.3 mg/dL (ref 8.4–10.5)
PTH: 94.5 pg/mL — ABNORMAL HIGH (ref 14.0–72.0)

## 2012-07-09 LAB — URINALYSIS, ROUTINE W REFLEX MICROSCOPIC
Ketones, ur: NEGATIVE mg/dL
Nitrite: NEGATIVE
Specific Gravity, Urine: 1.013 (ref 1.005–1.030)
Urobilinogen, UA: 1 mg/dL (ref 0.0–1.0)

## 2012-07-27 ENCOUNTER — Telehealth: Payer: Self-pay | Admitting: Endocrinology

## 2012-07-27 NOTE — Telephone Encounter (Signed)
Pt has questions for nurse about Prolia.

## 2012-07-27 NOTE — Telephone Encounter (Signed)
Pt called and was notified that the injection would not cause any severe issues to her thyroid.

## 2012-07-30 ENCOUNTER — Ambulatory Visit: Payer: Medicare Other | Admitting: Endocrinology

## 2012-08-02 ENCOUNTER — Ambulatory Visit (INDEPENDENT_AMBULATORY_CARE_PROVIDER_SITE_OTHER): Payer: Medicare Other | Admitting: General Practice

## 2012-08-02 DIAGNOSIS — M810 Age-related osteoporosis without current pathological fracture: Secondary | ICD-10-CM

## 2012-08-02 MED ORDER — DENOSUMAB 60 MG/ML ~~LOC~~ SOLN
60.0000 mg | Freq: Once | SUBCUTANEOUS | Status: AC
  Administered 2012-08-02: 60 mg via SUBCUTANEOUS

## 2012-08-05 ENCOUNTER — Ambulatory Visit: Payer: Medicare Other | Admitting: Endocrinology

## 2012-08-23 ENCOUNTER — Other Ambulatory Visit: Payer: Self-pay | Admitting: Endocrinology

## 2012-11-23 ENCOUNTER — Other Ambulatory Visit: Payer: Self-pay | Admitting: Endocrinology

## 2012-12-17 ENCOUNTER — Other Ambulatory Visit: Payer: Self-pay

## 2013-01-04 ENCOUNTER — Other Ambulatory Visit: Payer: Self-pay | Admitting: Endocrinology

## 2013-01-04 ENCOUNTER — Ambulatory Visit: Payer: 59

## 2013-01-05 ENCOUNTER — Other Ambulatory Visit: Payer: Self-pay | Admitting: *Deleted

## 2013-01-05 MED ORDER — FLUTICASONE PROPIONATE 50 MCG/ACT NA SUSP: 2.0000 | Freq: Every day | NASAL | Status: DC

## 2013-01-11 ENCOUNTER — Ambulatory Visit: Payer: 59

## 2013-01-11 ENCOUNTER — Telehealth: Payer: Self-pay

## 2013-01-11 NOTE — Telephone Encounter (Signed)
Please call pt and get pt's new insurance info and forward to Ruby at [facility] so we you can authorize pt's Prolia injection

## 2013-01-19 ENCOUNTER — Other Ambulatory Visit: Payer: Self-pay | Admitting: *Deleted

## 2013-01-19 MED ORDER — DABIGATRAN ETEXILATE MESYLATE 150 MG PO CAPS: 150.0000 mg | ORAL_CAPSULE | Freq: Two times a day (BID) | ORAL | Status: DC

## 2013-02-01 ENCOUNTER — Ambulatory Visit: Payer: 59

## 2013-02-04 ENCOUNTER — Ambulatory Visit
Admission: RE | Admit: 2013-02-04 | Discharge: 2013-02-04 | Disposition: A | Discharge status: Home / Self Care | Payer: Medicare PPO | Source: Ambulatory Visit

## 2013-02-04 DIAGNOSIS — Z1231 Encounter for screening mammogram for malignant neoplasm of breast: Secondary | ICD-10-CM

## 2013-02-21 DIAGNOSIS — Z0279 Encounter for issue of other medical certificate: Secondary | ICD-10-CM

## 2013-02-22 ENCOUNTER — Other Ambulatory Visit: Payer: Self-pay | Admitting: *Deleted

## 2013-02-22 ENCOUNTER — Other Ambulatory Visit: Payer: Self-pay | Admitting: Endocrinology

## 2013-02-22 MED ORDER — LOVASTATIN 40 MG PO TABS: 40.0000 mg | ORAL_TABLET | Freq: Every day | ORAL | Status: DC

## 2013-02-22 MED ORDER — LOSARTAN POTASSIUM-HCTZ 50-12.5 MG PO TABS: 1.0000 | ORAL_TABLET | Freq: Every day | ORAL | Status: DC

## 2013-03-31 ENCOUNTER — Other Ambulatory Visit: Payer: Self-pay | Admitting: Endocrinology

## 2013-03-31 ENCOUNTER — Other Ambulatory Visit: Payer: Self-pay

## 2013-03-31 MED ORDER — LOVASTATIN 40 MG PO TABS: 40.0000 mg | ORAL_TABLET | Freq: Every day | ORAL | Status: DC

## 2013-03-31 MED ORDER — LOSARTAN POTASSIUM-HCTZ 50-12.5 MG PO TABS: ORAL_TABLET | ORAL | Status: DC

## 2013-04-01 ENCOUNTER — Other Ambulatory Visit: Payer: Self-pay | Admitting: *Deleted

## 2013-04-01 ENCOUNTER — Other Ambulatory Visit: Payer: Self-pay | Admitting: Endocrinology

## 2013-04-01 MED ORDER — LOVASTATIN 40 MG PO TABS: 40.0000 mg | ORAL_TABLET | Freq: Every day | ORAL | Status: DC

## 2013-04-01 MED ORDER — LOVASTATIN 40 MG PO TABS: ORAL_TABLET | ORAL | Status: DC

## 2013-04-01 NOTE — Telephone Encounter (Signed)
See other message

## 2013-04-01 NOTE — Telephone Encounter (Signed)
Should pt be taking lovastatin 1 qd or bid?

## 2013-04-01 NOTE — Telephone Encounter (Signed)
Please call her Pharmacy has her meds all messed up

## 2013-04-01 NOTE — Telephone Encounter (Signed)
Re-sending rx to clarify dosing.

## 2013-04-18 ENCOUNTER — Other Ambulatory Visit: Payer: Self-pay | Admitting: Internal Medicine

## 2013-04-21 ENCOUNTER — Other Ambulatory Visit: Payer: Self-pay | Admitting: *Deleted

## 2013-04-21 ENCOUNTER — Telehealth: Payer: Self-pay

## 2013-04-21 MED ORDER — DABIGATRAN ETEXILATE MESYLATE 150 MG PO CAPS: ORAL_CAPSULE | ORAL | Status: DC

## 2013-04-21 NOTE — Telephone Encounter (Signed)
Due to financial reasons pt is asking for a 90 day supply refill for Pradaxa. Pt aware of appt. with Dr. Graciela Husbands & agreed to keep appt. on 06/14/13. Ok to fill med per [employer]

## 2013-06-14 ENCOUNTER — Ambulatory Visit (INDEPENDENT_AMBULATORY_CARE_PROVIDER_SITE_OTHER): Payer: Medicare PPO | Admitting: Internal Medicine

## 2013-06-14 ENCOUNTER — Encounter: Payer: Self-pay | Admitting: Internal Medicine

## 2013-06-14 VITALS — BP 151/83 | HR 74 | Ht <= 58 in | Wt 112.0 lb

## 2013-06-14 DIAGNOSIS — I10 Essential (primary) hypertension: Secondary | ICD-10-CM

## 2013-06-14 DIAGNOSIS — I498 Other specified cardiac arrhythmias: Secondary | ICD-10-CM

## 2013-06-14 DIAGNOSIS — I4891 Unspecified atrial fibrillation: Secondary | ICD-10-CM

## 2013-06-14 DIAGNOSIS — I471 Supraventricular tachycardia: Secondary | ICD-10-CM | POA: Insufficient documentation

## 2013-06-14 NOTE — Assessment & Plan Note (Signed)
History of atrial fibrillation. On Pradaxa. We'll need to check renal function

## 2013-06-14 NOTE — Assessment & Plan Note (Signed)
Asymptomatic. We'll continue current medications. We discussed treatment options but in the absence of symptoms which we reviewed we will follow. I've also instructed both the patient and her sister as to how take pulse and noted that persistence of her atrial tachycardia would be as it is his heart rates in the mid high 70s

## 2013-06-14 NOTE — Patient Instructions (Addendum)
Your physician wants you to follow-up in: 6 month with [facility].  You will receive a reminder letter in the mail two months in advance. If you don't receive a letter, please call our office to schedule the follow-up appointment.   Your physician recommends that you continue on your current medications as directed. Please refer to the Current Medication list given to you today.

## 2013-06-14 NOTE — Assessment & Plan Note (Signed)
Weill contorolled

## 2013-06-14 NOTE — Progress Notes (Signed)
Patient Care Team: Romero Belling, MD as PCP - General Duke Salvia, MD (Cardiology) Dorothyann Gibbs, MD (Ophthalmology) Louis Meckel, MD (Gastroenterology)   HPI  Nicole Hutchinson is a 77 y.o. female  Seen in followup because of atrial fibrillation and bradycardia. She has mitral regurgitation with moderate left atrial enlargement by echo in the fall with a dimension of 48 mm. Her thromboembolic risk factors include hypertension gender age and prior thromboembolic episode demonstrated by MRI scanning.   Anticoagulation is with dabigitran ; renal function was normal September 2013 She is to see Dr. Everardo All next month for her annual physical and we will defer reassessment of her kidney function until that time  She notes no symptoms of exercise intolerance lightheadedness shortness of breath or edema  Past Medical History  Diagnosis Date  . Atrial fibrillation 2008    On Pradaxa  . HTN (hypertension)   . Hyperthyroidism   . Osteoarthritis   . Osteoporosis   . Tubulovillous adenoma of colon 2002  . Dyslipidemia   . Hyperglycemia   . Moderate mitral regurgitation by prior echocardiogram     Normal left ventricular function left atrial enlargement echo 2010    Past Surgical History  Procedure Laterality Date  . Abdominal hysterectomy    . Right hemi-thyroidectomy    . Stress cardiolite  09/20/2003  . Dexa  12/29/2005  . Colon resection      Current Outpatient Prescriptions  Medication Sig Dispense Refill  . dabigatran (PRADAXA) 150 MG CAPS TAKE ONE CAPSULE BY MOUTH EVERY 12 HOURS  180 capsule  3  . fluticasone (FLONASE) 50 MCG/ACT nasal spray Place 2 sprays into the nose daily.  16 g  4  . latanoprost (XALATAN) 0.005 % ophthalmic solution Place 1 drop into both eyes at bedtime.  2.5 mL  12  . losartan-hydrochlorothiazide (HYZAAR) 50-12.5 MG per tablet TAKE ONE TABLET BY MOUTH ONCE DAILY. NEED OFFICE VISIT  30 tablet  3  . lovastatin (MEVACOR) 40 MG tablet TAKE 2 TABLETS  (80 MG TOTAL) BY MOUTH AT BEDTIME  60 tablet  3  . zoster vaccine live, PF, (ZOSTAVAX) 11914 UNT/0.65ML injection Inject 19,400 Units into the skin once.  1 each  0   No current facility-administered medications for this visit.    Allergies  Allergen Reactions  . Ace Inhibitors     REACTION: cough  . Codeine     Review of Systems negative except from HPI and PMH  Physical Exam BP 151/83  Pulse 74  Ht 4\' 6"  (1.372 m)  Wt 112 lb (50.803 kg)  BMI 26.99 kg/m2 Well developed and well nourished in no acute distress HENT normal E scleral and icterus clear Neck Supple JVP flat; carotids brisk and full Clear to ausculation  Regular rate and rhythm, no murmurs gallops or rub Soft with active bowel sounds No clubbing cyanosis { no Edema Alert and oriented, grossly normal motor and sensory function Skin Warm and Dry  ECG demonstrates atrial tachycardia with 2:1 block. Atrial rate is 148 ventricular rate 74 Intervals 21/13/43 Axis [id number] with right bundle branch block  Assessment and  Plan

## 2013-07-04 ENCOUNTER — Telehealth: Payer: Self-pay

## 2013-07-04 NOTE — Telephone Encounter (Signed)
Pt's sister advised

## 2013-07-04 NOTE — Telephone Encounter (Signed)
Pt would like to know if it is ok take Clartin and does she need to be fasting for labs? [phone]

## 2013-07-04 NOTE — Telephone Encounter (Signed)
Yes to both questions.

## 2013-07-12 ENCOUNTER — Ambulatory Visit: Payer: Medicare PPO | Admitting: Endocrinology

## 2013-07-13 ENCOUNTER — Ambulatory Visit (INDEPENDENT_AMBULATORY_CARE_PROVIDER_SITE_OTHER): Payer: Medicare PPO | Admitting: Endocrinology

## 2013-07-13 ENCOUNTER — Encounter: Payer: Self-pay | Admitting: Endocrinology

## 2013-07-13 VITALS — BP 126/80 | HR 76 | Ht <= 58 in | Wt 109.0 lb

## 2013-07-13 DIAGNOSIS — D649 Anemia, unspecified: Secondary | ICD-10-CM

## 2013-07-13 DIAGNOSIS — I4891 Unspecified atrial fibrillation: Secondary | ICD-10-CM

## 2013-07-13 DIAGNOSIS — I10 Essential (primary) hypertension: Secondary | ICD-10-CM

## 2013-07-13 DIAGNOSIS — M810 Age-related osteoporosis without current pathological fracture: Secondary | ICD-10-CM

## 2013-07-13 DIAGNOSIS — Z0000 Encounter for general adult medical examination without abnormal findings: Secondary | ICD-10-CM

## 2013-07-13 DIAGNOSIS — E079 Disorder of thyroid, unspecified: Secondary | ICD-10-CM | POA: Insufficient documentation

## 2013-07-13 DIAGNOSIS — R7309 Other abnormal glucose: Secondary | ICD-10-CM

## 2013-07-13 DIAGNOSIS — E7800 Pure hypercholesterolemia, unspecified: Secondary | ICD-10-CM

## 2013-07-13 DIAGNOSIS — Z23 Encounter for immunization: Secondary | ICD-10-CM

## 2013-07-13 DIAGNOSIS — Z79899 Other long term (current) drug therapy: Secondary | ICD-10-CM

## 2013-07-13 LAB — HEPATIC FUNCTION PANEL
ALT: 14 U/L (ref 0–35)
AST: 25 U/L (ref 0–37)
Albumin: 4 g/dL (ref 3.5–5.2)
Total Bilirubin: 0.7 mg/dL (ref 0.3–1.2)
Total Protein: 6.4 g/dL (ref 6.0–8.3)

## 2013-07-13 LAB — URINALYSIS, ROUTINE W REFLEX MICROSCOPIC
Bilirubin Urine: NEGATIVE
Hgb urine dipstick: NEGATIVE
Ketones, ur: NEGATIVE
Nitrite: NEGATIVE
Total Protein, Urine: NEGATIVE
Urobilinogen, UA: 0.2 (ref 0.0–1.0)
pH: 6 (ref 5.0–8.0)

## 2013-07-13 LAB — CBC WITH DIFFERENTIAL/PLATELET
Basophils Absolute: 0 10*3/uL (ref 0.0–0.1)
Eosinophils Relative: 0.1 % (ref 0.0–5.0)
HCT: 34.5 % — ABNORMAL LOW (ref 36.0–46.0)
Lymphs Abs: 0.9 10*3/uL (ref 0.7–4.0)
MCHC: 33.9 g/dL (ref 30.0–36.0)
MCV: 90.1 fl (ref 78.0–100.0)
Monocytes Absolute: 0.6 10*3/uL (ref 0.1–1.0)
Neutrophils Relative %: 76.7 % (ref 43.0–77.0)
Platelets: 189 10*3/uL (ref 150.0–400.0)
RDW: 13.7 % (ref 11.5–14.6)

## 2013-07-13 LAB — BASIC METABOLIC PANEL
BUN: 22 mg/dL (ref 6–23)
Calcium: 9.4 mg/dL (ref 8.4–10.5)
Creatinine, Ser: 0.8 mg/dL (ref 0.4–1.2)
GFR: 71.14 mL/min (ref >60.00)
Glucose, Bld: 91 mg/dL (ref 70–99)
Potassium: 4.3 mEq/L (ref 3.5–5.1)
Sodium: 142 mEq/L (ref 135–145)

## 2013-07-13 LAB — LIPID PANEL
HDL: 74.5 mg/dL (ref >39.00)
Triglycerides: 33 mg/dL (ref 0.0–149.0)
VLDL: 6.6 mg/dL (ref 0.0–40.0)

## 2013-07-13 NOTE — Patient Instructions (Addendum)
please consider these measures for your health:  minimize alcohol.  do not use tobacco products.  have a colonoscopy at least every 10 years from age 75.  Women should have an annual mammogram from age 40.  keep firearms safely stored.  always use seat belts.  have working smoke alarms in your home.  see an eye doctor and dentist regularly.  never drive under the influence of alcohol or drugs (including prescription drugs).  those with fair skin should take precautions against the sun. it is critically important to prevent falling down (keep floor areas well-lit, dry, and free of loose objects.  If you have a cane, walker, or wheelchair, you should use it, even for short trips around the house.  Also, try not to rush). blood tests are being requested for you today.  We'll contact you with results. Please return in 1 year.  

## 2013-07-13 NOTE — Progress Notes (Signed)
Subjective:    Patient ID: Nicole Hutchinson, female    DOB: 1936/02/14, 77 y.o.   MRN: [id number]  HPI Pt is here for regular wellness examination, and is feeling pretty well in general, and says chronic med probs are stable, except as noted below.  She refuses pneumovax Past Medical History  Diagnosis Date  . Atrial fibrillation 2008    On Pradaxa  . HTN (hypertension)   . Hyperthyroidism   . Osteoarthritis   . Osteoporosis   . Tubulovillous adenoma of colon 2002  . Dyslipidemia   . Hyperglycemia   . Moderate mitral regurgitation by prior echocardiogram     Normal left ventricular function left atrial enlargement echo 2010    Past Surgical History  Procedure Laterality Date  . Abdominal hysterectomy    . Right hemi-thyroidectomy    . Stress cardiolite  09/20/2003  . Dexa  12/29/2005  . Colon resection      History   Social History  . Marital Status: Single    Spouse Name: N/A    Number of Children: N/A  . Years of Education: N/A   Occupational History  . Retired    Social History Main Topics  . Smoking status: Never Smoker   . Smokeless tobacco: Not on file  . Alcohol Use: No  . Drug Use: No  . Sexual Activity: Not on file   Other Topics Concern  . Not on file   Social History Narrative  . No narrative on file    Current Outpatient Prescriptions on File Prior to Visit  Medication Sig Dispense Refill  . dabigatran (PRADAXA) 150 MG CAPS TAKE ONE CAPSULE BY MOUTH EVERY 12 HOURS  180 capsule  3  . fluticasone (FLONASE) 50 MCG/ACT nasal spray Place 2 sprays into the nose daily.  16 g  4  . latanoprost (XALATAN) 0.005 % ophthalmic solution Place 1 drop into both eyes at bedtime.  2.5 mL  12  . losartan-hydrochlorothiazide (HYZAAR) 50-12.5 MG per tablet TAKE ONE TABLET BY MOUTH ONCE DAILY. NEED OFFICE VISIT  30 tablet  3  . lovastatin (MEVACOR) 40 MG tablet TAKE 2 TABLETS (80 MG TOTAL) BY MOUTH AT BEDTIME  60 tablet  3  . zoster vaccine live, PF, (ZOSTAVAX)  19400 UNT/0.65ML injection Inject 19,400 Units into the skin once.  1 each  0   No current facility-administered medications on file prior to visit.    Allergies  Allergen Reactions  . Ace Inhibitors     REACTION: cough  . Codeine     Family History  Problem Relation Age of Onset  . Cancer Neg Hx     BP 126/80  Pulse 76  Ht 4\' 6"  (1.372 m)  Wt 109 lb (49.442 kg)  BMI 26.27 kg/m2  SpO2 95%    Review of Systems  Constitutional: Negative for fever and unexpected weight change.  HENT:       No change in chronic hearing loss  Eyes: Negative for visual disturbance.  Respiratory: Negative for shortness of breath.   Cardiovascular: Negative for chest pain.  Gastrointestinal: Negative for blood in stool.  Endocrine: Negative for cold intolerance.  Genitourinary: Negative for vaginal bleeding.  Musculoskeletal: Negative for back pain.  Skin: Negative for rash.  Allergic/Immunologic: Positive for environmental allergies.  Neurological: Negative for syncope and headaches.  Hematological: Does not bruise/bleed easily.  Psychiatric/Behavioral: Negative for dysphoric mood.       Objective:   Physical Exam VS: see vs page GEN: no  distress HEAD: head: no deformity eyes: no periorbital swelling, no proptosis external nose and ears are normal mouth: no lesion seen Ears: bilat hering aids. NECK: a healed scar is present.  i do not appreciate a nodule in the thyroid or elsewhere in the neck CHEST WALL: no deformity LUNGS:  Clear to auscultation BREASTS:  sees gyn CV: reg rate and rhythm, no murmur ABD: abdomen is soft, nontender.  no hepatosplenomegaly.  not distended.  no hernia GENITALIA/RECTAL: sees gyn MUSCULOSKELETAL: muscle bulk and strength are grossly normal.  no obvious joint swelling.  gait is normal and steady EXTEMITIES: no deformity.  no ulcer on the feet.  feet are of normal color and temp.  no edema PULSES: dorsalis pedis intact bilat.  no carotid  bruit NEURO:  cn 2-12 grossly intact.   readily moves all 4's.  sensation is intact to touch on the feet SKIN:  Normal texture and temperature.  No rash or suspicious lesion is visible.  Moderate patchy hyperpigmentation on the legs.   NODES:  None palpable at the neck PSYCH: alert, oriented x3.  Does not appear anxious nor depressed.         Assessment & Plan:  Wellness visit today, with problems stable, except as noted. we discussed code status.  pt requests full code, but would not want to be started or maintained on artificial life-support measures if there was not a reasonable chance of recovery.       SEPARATE EVALUATION FOLLOWS--EACH PROBLEM HERE IS NEW, NOT RESPONDING TO TREATMENT, OR POSES SIGNIFICANT RISK TO THE PATIENT'S HEALTH: HISTORY OF THE PRESENT ILLNESS: Anemia is noted on today's labs.  New finding PAST MEDICAL HISTORY reviewed and up to date today REVIEW OF SYSTEMS: Denies hematuria PHYSICAL EXAMINATION: VITAL SIGNS:  See vs page Skin: no ecchymoses GENERAL: no distress LAB/XRAY RESULTS: Lab Results  Component Value Date   WBC 6.7 07/13/2013   HGB 11.7* 07/13/2013   HCT 34.5* 07/13/2013   MCV 90.1 07/13/2013   PLT 189.0 07/13/2013  IMPRESSION: Anemia, new, uncertain etiology. PLAN: See instruction page

## 2013-07-15 ENCOUNTER — Other Ambulatory Visit: Payer: Self-pay | Admitting: Endocrinology

## 2013-07-15 ENCOUNTER — Telehealth: Payer: Self-pay | Admitting: Endocrinology

## 2013-07-15 NOTE — Telephone Encounter (Signed)
Pt advised test were added on, no results yet.

## 2013-07-18 ENCOUNTER — Other Ambulatory Visit: Payer: Self-pay | Admitting: Internal Medicine

## 2013-07-18 ENCOUNTER — Telehealth: Payer: Self-pay

## 2013-07-18 NOTE — Telephone Encounter (Signed)
Pt left voicemail requesting lab results [phone]

## 2013-07-18 NOTE — Telephone Encounter (Signed)
yes

## 2013-07-18 NOTE — Telephone Encounter (Signed)
All are fine, except for mild anemia. Please come in to have iron and b-12 checked.

## 2013-07-18 NOTE — Telephone Encounter (Signed)
Does pt need to have blood redrawn for iron and b-12?

## 2013-07-19 ENCOUNTER — Other Ambulatory Visit: Payer: Self-pay

## 2013-07-19 ENCOUNTER — Telehealth: Payer: Self-pay | Admitting: Endocrinology

## 2013-07-19 MED ORDER — LOVASTATIN 40 MG PO TABS: ORAL_TABLET | ORAL | Status: DC

## 2013-07-19 MED ORDER — LOSARTAN POTASSIUM-HCTZ 50-12.5 MG PO TABS: ORAL_TABLET | ORAL | Status: DC

## 2013-07-19 NOTE — Telephone Encounter (Signed)
Pt advised and states she will come in for lab on thursday

## 2013-07-19 NOTE — Telephone Encounter (Signed)
Pt's sister advised she could get labs done at any time

## 2013-07-20 ENCOUNTER — Telehealth: Payer: Self-pay | Admitting: Endocrinology

## 2013-07-20 MED ORDER — LATANOPROST 0.005 % OP SOLN: 1.0000 [drp] | Freq: Every day | OPHTHALMIC | Status: DC

## 2013-07-20 MED ORDER — LOVASTATIN 40 MG PO TABS: ORAL_TABLET | ORAL | Status: DC

## 2013-07-20 NOTE — Telephone Encounter (Signed)
Pt advised.

## 2013-07-21 ENCOUNTER — Other Ambulatory Visit: Payer: Self-pay

## 2013-07-21 ENCOUNTER — Other Ambulatory Visit (INDEPENDENT_AMBULATORY_CARE_PROVIDER_SITE_OTHER): Payer: Medicare HMO

## 2013-07-21 ENCOUNTER — Other Ambulatory Visit: Payer: Self-pay | Admitting: *Deleted

## 2013-07-21 DIAGNOSIS — D649 Anemia, unspecified: Secondary | ICD-10-CM

## 2013-07-21 DIAGNOSIS — Z79899 Other long term (current) drug therapy: Secondary | ICD-10-CM

## 2013-07-21 LAB — IBC PANEL
Iron: 61 ug/dL (ref 42–145)
Saturation Ratios: 17 % — ABNORMAL LOW (ref 20.0–50.0)
Transferrin: 255.9 mg/dL (ref 212.0–360.0)

## 2013-07-21 LAB — VITAMIN B12: Vitamin B-12: 150 pg/mL — ABNORMAL LOW (ref 211–911)

## 2013-07-21 MED ORDER — LOVASTATIN 40 MG PO TABS: ORAL_TABLET | ORAL | Status: DC

## 2013-07-21 MED ORDER — LOSARTAN POTASSIUM-HCTZ 50-12.5 MG PO TABS: ORAL_TABLET | ORAL | Status: DC

## 2013-07-21 NOTE — Telephone Encounter (Signed)
Pt requesting 90 day supply

## 2013-08-02 ENCOUNTER — Ambulatory Visit (INDEPENDENT_AMBULATORY_CARE_PROVIDER_SITE_OTHER): Payer: Commercial Managed Care - HMO

## 2013-08-02 DIAGNOSIS — E538 Deficiency of other specified B group vitamins: Secondary | ICD-10-CM

## 2013-08-03 MED ORDER — CYANOCOBALAMIN 1000 MCG/ML IJ SOLN
1000.0000 ug | Freq: Once | INTRAMUSCULAR | Status: AC
  Administered 2013-08-02: 1000 ug via INTRAMUSCULAR

## 2013-08-09 NOTE — Progress Notes (Signed)
This encounter was used for a Nurse visit only.

## 2013-09-02 ENCOUNTER — Other Ambulatory Visit: Payer: Self-pay | Admitting: Endocrinology

## 2013-09-03 NOTE — Telephone Encounter (Signed)
Patient states Has an appointment with the RN on Dec 1 at 9;45 needs to cancle that and have the office call her Monday to Reschedule for Tuesday.

## 2013-09-05 ENCOUNTER — Other Ambulatory Visit: Payer: Self-pay | Admitting: Endocrinology

## 2013-09-05 ENCOUNTER — Other Ambulatory Visit: Payer: Self-pay | Admitting: *Deleted

## 2013-09-05 MED ORDER — LOVASTATIN 40 MG PO TABS: ORAL_TABLET | ORAL | Status: DC

## 2013-09-06 ENCOUNTER — Ambulatory Visit (INDEPENDENT_AMBULATORY_CARE_PROVIDER_SITE_OTHER): Payer: Commercial Managed Care - HMO | Admitting: *Deleted

## 2013-09-06 DIAGNOSIS — E538 Deficiency of other specified B group vitamins: Secondary | ICD-10-CM

## 2013-09-06 MED ORDER — CYANOCOBALAMIN 1000 MCG/ML IJ SOLN
1000.0000 ug | Freq: Once | INTRAMUSCULAR | Status: AC
  Administered 2013-09-06: 1000 ug via INTRAMUSCULAR

## 2013-10-07 ENCOUNTER — Ambulatory Visit (INDEPENDENT_AMBULATORY_CARE_PROVIDER_SITE_OTHER): Payer: Commercial Managed Care - HMO | Admitting: *Deleted

## 2013-10-07 ENCOUNTER — Other Ambulatory Visit: Payer: Self-pay | Admitting: *Deleted

## 2013-10-07 DIAGNOSIS — D649 Anemia, unspecified: Secondary | ICD-10-CM

## 2013-10-07 MED ORDER — CYANOCOBALAMIN 1000 MCG/ML IJ SOLN
1000.0000 ug | Freq: Once | INTRAMUSCULAR | Status: AC
  Administered 2013-10-07: 1000 ug via INTRAMUSCULAR

## 2013-10-07 MED ORDER — LOSARTAN POTASSIUM-HCTZ 50-12.5 MG PO TABS: ORAL_TABLET | ORAL | Status: DC

## 2013-10-07 MED ORDER — LOVASTATIN 40 MG PO TABS: ORAL_TABLET | ORAL | Status: DC

## 2013-10-07 MED ORDER — DABIGATRAN ETEXILATE MESYLATE 150 MG PO CAPS: 150.0000 mg | ORAL_CAPSULE | Freq: Two times a day (BID) | ORAL | Status: DC

## 2013-10-10 ENCOUNTER — Ambulatory Visit: Payer: Medicare PPO

## 2013-11-03 ENCOUNTER — Ambulatory Visit (INDEPENDENT_AMBULATORY_CARE_PROVIDER_SITE_OTHER): Payer: Medicare HMO

## 2013-11-03 DIAGNOSIS — D649 Anemia, unspecified: Secondary | ICD-10-CM

## 2013-11-03 MED ORDER — CYANOCOBALAMIN 1000 MCG/ML IJ SOLN
1000.0000 ug | Freq: Once | INTRAMUSCULAR | Status: AC
  Administered 2013-11-03: 1000 ug via INTRAMUSCULAR

## 2013-11-07 ENCOUNTER — Ambulatory Visit: Payer: Medicare PPO

## 2013-12-05 ENCOUNTER — Ambulatory Visit: Payer: Medicare HMO

## 2013-12-05 ENCOUNTER — Telehealth: Payer: Self-pay

## 2013-12-05 ENCOUNTER — Ambulatory Visit (INDEPENDENT_AMBULATORY_CARE_PROVIDER_SITE_OTHER): Payer: Medicare HMO

## 2013-12-05 DIAGNOSIS — D649 Anemia, unspecified: Secondary | ICD-10-CM

## 2013-12-05 MED ORDER — CYANOCOBALAMIN 1000 MCG/ML IJ SOLN
1000.0000 ug | Freq: Once | INTRAMUSCULAR | Status: AC
  Administered 2013-12-05: 1000 ug via INTRAMUSCULAR

## 2013-12-05 NOTE — Telephone Encounter (Signed)
You can now back off to every 3 months

## 2013-12-05 NOTE — Telephone Encounter (Signed)
Pt came to office for B 12 inject and was wondering how many more b 12 injections she needed to have. She states back in October she was told that she only needed the injections for 6 months and to come back and have blood drawn to check levels. I looked in her chart and could not find that and informed pt. I told her I would check with you. Please advise,  Thanks!

## 2013-12-05 NOTE — Telephone Encounter (Signed)
Informed.

## 2013-12-06 ENCOUNTER — Ambulatory Visit: Payer: Medicare HMO

## 2014-01-03 ENCOUNTER — Ambulatory Visit: Payer: Medicare HMO | Admitting: Cardiology

## 2014-01-05 ENCOUNTER — Ambulatory Visit: Payer: Medicare HMO

## 2014-01-16 ENCOUNTER — Other Ambulatory Visit: Payer: Self-pay

## 2014-01-16 DIAGNOSIS — Z1231 Encounter for screening mammogram for malignant neoplasm of breast: Secondary | ICD-10-CM

## 2014-01-24 ENCOUNTER — Ambulatory Visit (INDEPENDENT_AMBULATORY_CARE_PROVIDER_SITE_OTHER): Payer: Commercial Managed Care - HMO | Admitting: Cardiology

## 2014-01-24 ENCOUNTER — Encounter: Payer: Self-pay | Admitting: Cardiology

## 2014-01-24 VITALS — BP 160/75 | HR 62 | Ht <= 58 in | Wt 113.0 lb

## 2014-01-24 DIAGNOSIS — I4891 Unspecified atrial fibrillation: Secondary | ICD-10-CM

## 2014-01-24 DIAGNOSIS — I498 Other specified cardiac arrhythmias: Secondary | ICD-10-CM

## 2014-01-24 DIAGNOSIS — I471 Supraventricular tachycardia: Secondary | ICD-10-CM

## 2014-01-24 NOTE — Patient Instructions (Signed)
Your physician wants you to follow-up in: 6 months with Dr. Klein. You will receive a reminder letter in the mail two months in advance. If you don't receive a letter, please call our office to schedule the follow-up appointment.  Your physician recommends that you continue on your current medications as directed. Please refer to the Current Medication list given to you today.  

## 2014-01-24 NOTE — Progress Notes (Signed)
ELECTROPHYSIOLOGY OFFICE NOTE   Patient ID: Nicole Hutchinson MRN: [id number], DOB/AGE: [birth year]   Date of Visit: 01/24/2014  Primary Physician: Renato Shin, MD Primary Cardiologist: Jolyn Nap, MD Reason for Visit: EP follow-up  History of Present Illness  Nicole Hutchinson is a 78 y.o. female with PAF and PAT who presents today for routine electrophysiology followup. Since last being seen in our clinic, she reports she is doing well and has no complaints. She denies any functional limitations and is able to perform all ADLs without difficulty. She continues to be active and enjoys shopping and spending time with her sisters. She has no difficulty performing household chores or walking for long periods of time. She denies chest pain or shortness of breath. She denies palpitations, dizziness, near syncope or syncope. She has chronic intermittent LE swelling which has been ongoing for years and improves with elevation of her legs. She denies worsening. She denies orthopnea or PND. She is compliant with medications.  Past Medical History Past Medical History  Diagnosis Date  . Atrial fibrillation 2008    On Pradaxa  . HTN (hypertension)   . Hyperthyroidism   . Osteoarthritis   . Osteoporosis   . Tubulovillous adenoma of colon 2002  . Dyslipidemia   . Hyperglycemia   . Moderate mitral regurgitation by prior echocardiogram     Normal left ventricular function left atrial enlargement echo 2010    Past Surgical History Past Surgical History  Procedure Laterality Date  . Abdominal hysterectomy    . Right hemi-thyroidectomy    . Stress cardiolite  09/20/2003  . Dexa  12/29/2005  . Colon resection      Allergies/Intolerances Allergies  Allergen Reactions  . Ace Inhibitors     REACTION: cough  . Codeine     Current Home Medications Current Outpatient Prescriptions  Medication Sig Dispense Refill  . dabigatran (PRADAXA) 150 MG CAPS capsule Take 1 capsule (150 mg total) by  mouth 2 (two) times daily.  180 capsule  2  . fluticasone (FLONASE) 50 MCG/ACT nasal spray USE TWO SPRAY IN EACH NOSTRIL ONCE DAILY  16 g  2  . latanoprost (XALATAN) 0.005 % ophthalmic solution Place 1 drop into both eyes at bedtime.  2.5 mL  12  . losartan-hydrochlorothiazide (HYZAAR) 50-12.5 MG per tablet TAKE ONE TABLET BY MOUTH ONCE DAILY. NEED OFFICE VISIT  90 tablet  2  . lovastatin (MEVACOR) 40 MG tablet TAKE 2 TABLETS (80 MG TOTAL) BY MOUTH AT BEDTIME  180 tablet  2  . zoster vaccine live, PF, (ZOSTAVAX) 41324 UNT/0.65ML injection Inject 19,400 Units into the skin once.  1 each  0   No current facility-administered medications for this visit.    Social History History   Social History  . Marital Status: Single    Spouse Name: N/A    Number of Children: N/A  . Years of Education: N/A   Occupational History  . Retired    Social History Main Topics  . Smoking status: Never Smoker   . Smokeless tobacco: Not on file  . Alcohol Use: No  . Drug Use: No  . Sexual Activity: Not on file   Other Topics Concern  . Not on file   Social History Narrative  . No narrative on file     Review of Systems General: No chills, fever, night sweats or weight changes Cardiovascular: No chest pain, dyspnea on exertion, edema, orthopnea, palpitations, paroxysmal nocturnal dyspnea Dermatological: No rash, lesions or masses Respiratory:  No cough, dyspnea Urologic: No hematuria, dysuria Abdominal: No nausea, vomiting, diarrhea, bright red blood per rectum, melena, or hematemesis Neurologic: No visual changes, weakness, changes in mental status All other systems reviewed and are otherwise negative except as noted above.  Physical Exam Vitals: Blood pressure 160/75, pulse 62, height 4\' 6"  (1.372 m), weight 113 lb (51.256 kg).  General: Well developed, well appearing 78 y.o. female in no acute distress. HEENT: Normocephalic, atraumatic. EOMs intact. Sclera nonicteric. Oropharynx  clear.  Neck: Supple. No JVD. Lungs: Respirations regular and unlabored, CTA bilaterally. No wheezes, rales or rhonchi. Heart: Regular. S1, S2 present. No murmurs, rub, S3 or S4. Abdomen: Soft, non-distended.   Extremities: No clubbing, cyanosis or edema. PT/Radials 2+ and equal bilaterally. Psych: Normal affect. Neuro: Alert and oriented X 3. Moves all extremities spontaneously.   Diagnostics 12-lead ECG today - sinus bradycardia with competing junctional rhythm at 61 bpm  Assessment and Plan 1. Junctional rhythm 2. Sinus node dysfunction 3. Paroxysmal atrial fibrillation 4. Paroxysmal atrial tachycardia  Nicole Hutchinson presents for routine 6 month follow-up. She is doing well without any concerns or complaints. She has sinus node dysfunction with sinus bradycardia and competing junctional rhythm; however, this is not new for her. She is stable without evidence of significant bradycardia or pauses. Continue to avoid AV nodal blocking / rate slowing medications. Reviewed plan of care with Dr. Lovena Le in clinic today who agrees. She was counseled regarding symptoms of bradycardia including fatigue, DOE, dizziness or syncope. She was instructed to report to [place] immediately if she develops any of the aforementioned symptoms. She will return for follow-up with Dr. Caryl Comes in 6 months.  Darrick Huntsman, PA-C 01/24/2014, 9:44 AM

## 2014-02-07 ENCOUNTER — Ambulatory Visit
Admission: RE | Admit: 2014-02-07 | Discharge: 2014-02-07 | Disposition: A | Discharge status: Home / Self Care | Payer: Commercial Managed Care - HMO | Source: Ambulatory Visit

## 2014-02-07 DIAGNOSIS — Z1231 Encounter for screening mammogram for malignant neoplasm of breast: Secondary | ICD-10-CM

## 2014-03-16 ENCOUNTER — Ambulatory Visit: Payer: Medicare HMO

## 2014-03-27 ENCOUNTER — Telehealth: Payer: Self-pay | Admitting: Internal Medicine

## 2014-03-27 ENCOUNTER — Telehealth: Payer: Self-pay | Admitting: Endocrinology

## 2014-03-27 NOTE — Telephone Encounter (Signed)
Pt would like to come in for her b 12 tomorrow but is also saying something about labs I will schedule her for both labs and a b12 inj tomorrow

## 2014-03-27 NOTE — Telephone Encounter (Signed)
Left message on personal voicemail advising ok to take her Pradaxa prior to exam.

## 2014-03-27 NOTE — Telephone Encounter (Signed)
New message    Pt is having a pap smear tomorrow.  Should she stop her prodaxa prior to exam

## 2014-03-28 ENCOUNTER — Ambulatory Visit: Payer: Commercial Managed Care - HMO

## 2014-03-30 ENCOUNTER — Ambulatory Visit (INDEPENDENT_AMBULATORY_CARE_PROVIDER_SITE_OTHER): Payer: Commercial Managed Care - HMO

## 2014-03-30 DIAGNOSIS — D649 Anemia, unspecified: Secondary | ICD-10-CM

## 2014-03-30 MED ORDER — CYANOCOBALAMIN 1000 MCG/ML IJ SOLN
1000.0000 ug | Freq: Once | INTRAMUSCULAR | Status: AC
  Administered 2014-03-30: 1000 ug via INTRAMUSCULAR

## 2014-05-29 ENCOUNTER — Telehealth: Payer: Self-pay | Admitting: Endocrinology

## 2014-05-29 ENCOUNTER — Other Ambulatory Visit: Payer: Self-pay | Admitting: Endocrinology

## 2014-05-29 NOTE — Telephone Encounter (Signed)
Medication sent per pt's request. Unable to contact pt. #'s on file are not correct.

## 2014-05-29 NOTE — Telephone Encounter (Signed)
Pt would like flonase called in 90 day supply if possible and would like a call when this is done

## 2014-07-11 ENCOUNTER — Telehealth: Payer: Self-pay | Admitting: Endocrinology

## 2014-07-11 ENCOUNTER — Other Ambulatory Visit (HOSPITAL_COMMUNITY): Payer: Self-pay | Admitting: Cardiology

## 2014-07-11 DIAGNOSIS — I6523 Occlusion and stenosis of bilateral carotid arteries: Secondary | ICD-10-CM

## 2014-07-11 NOTE — Telephone Encounter (Signed)
See below and please advise, Thanks!  

## 2014-07-11 NOTE — Telephone Encounter (Signed)
Pt's sister advised

## 2014-07-11 NOTE — Telephone Encounter (Signed)
Pt would like a referral and an order for the carotid test sent to Dr. Caryl Comes at [facility] please  Please call when completed

## 2014-07-11 NOTE — Telephone Encounter (Signed)
done

## 2014-07-14 ENCOUNTER — Encounter: Payer: Commercial Managed Care - HMO | Admitting: Endocrinology

## 2014-07-19 ENCOUNTER — Ambulatory Visit (HOSPITAL_COMMUNITY): Payer: Medicare HMO | Attending: Endocrinology | Admitting: Cardiology

## 2014-07-19 DIAGNOSIS — Z8673 Personal history of transient ischemic attack (TIA), and cerebral infarction without residual deficits: Secondary | ICD-10-CM | POA: Diagnosis not present

## 2014-07-19 DIAGNOSIS — I10 Essential (primary) hypertension: Secondary | ICD-10-CM | POA: Insufficient documentation

## 2014-07-19 DIAGNOSIS — I6523 Occlusion and stenosis of bilateral carotid arteries: Secondary | ICD-10-CM

## 2014-07-19 DIAGNOSIS — E785 Hyperlipidemia, unspecified: Secondary | ICD-10-CM | POA: Diagnosis present

## 2014-07-19 NOTE — Progress Notes (Signed)
Carotid duplex performed 

## 2014-08-03 ENCOUNTER — Encounter: Payer: Commercial Managed Care - HMO | Admitting: Endocrinology

## 2014-08-11 ENCOUNTER — Ambulatory Visit (INDEPENDENT_AMBULATORY_CARE_PROVIDER_SITE_OTHER): Payer: Commercial Managed Care - HMO | Admitting: Endocrinology

## 2014-08-11 ENCOUNTER — Encounter: Payer: Self-pay | Admitting: Endocrinology

## 2014-08-11 VITALS — BP 102/64 | HR 59 | Temp 98.2°F | Ht <= 58 in | Wt 112.0 lb

## 2014-08-11 DIAGNOSIS — Z0000 Encounter for general adult medical examination without abnormal findings: Secondary | ICD-10-CM

## 2014-08-11 DIAGNOSIS — Z23 Encounter for immunization: Secondary | ICD-10-CM

## 2014-08-11 DIAGNOSIS — D509 Iron deficiency anemia, unspecified: Secondary | ICD-10-CM

## 2014-08-11 DIAGNOSIS — I10 Essential (primary) hypertension: Secondary | ICD-10-CM | POA: Diagnosis not present

## 2014-08-11 DIAGNOSIS — E7800 Pure hypercholesterolemia, unspecified: Secondary | ICD-10-CM

## 2014-08-11 DIAGNOSIS — R739 Hyperglycemia, unspecified: Secondary | ICD-10-CM

## 2014-08-11 DIAGNOSIS — M810 Age-related osteoporosis without current pathological fracture: Secondary | ICD-10-CM

## 2014-08-11 LAB — URINALYSIS, ROUTINE W REFLEX MICROSCOPIC
Bilirubin Urine: NEGATIVE
Hgb urine dipstick: NEGATIVE
Ketones, ur: NEGATIVE
Nitrite: NEGATIVE
PH: 7 (ref 5.0–8.0)
SPECIFIC GRAVITY, URINE: 1.015 (ref 1.000–1.030)
TOTAL PROTEIN, URINE-UPE24: NEGATIVE
URINE GLUCOSE: NEGATIVE
Urobilinogen, UA: 0.2 (ref 0.0–1.0)

## 2014-08-11 LAB — CBC WITH DIFFERENTIAL/PLATELET
BASOS PCT: 0.8 % (ref 0.0–3.0)
Basophils Absolute: 0 10*3/uL (ref 0.0–0.1)
EOS PCT: 0.1 % (ref 0.0–5.0)
Eosinophils Absolute: 0 10*3/uL (ref 0.0–0.7)
HCT: 36 % (ref 36.0–46.0)
Hemoglobin: 11.9 g/dL — ABNORMAL LOW (ref 12.0–15.0)
LYMPHS PCT: 15.7 % (ref 12.0–46.0)
Lymphs Abs: 0.9 10*3/uL (ref 0.7–4.0)
MCHC: 33 g/dL (ref 30.0–36.0)
MCV: 92.2 fl (ref 78.0–100.0)
MONOS PCT: 10.4 % (ref 3.0–12.0)
Monocytes Absolute: 0.6 10*3/uL (ref 0.1–1.0)
NEUTROS PCT: 73 % (ref 43.0–77.0)
Neutro Abs: 4.4 10*3/uL (ref 1.4–7.7)
PLATELETS: 188 10*3/uL (ref 150.0–400.0)
RBC: 3.91 Mil/uL (ref 3.87–5.11)
RDW: 14.1 % (ref 11.5–15.5)
WBC: 6 10*3/uL (ref 4.0–10.5)

## 2014-08-11 LAB — IBC PANEL
IRON: 74 ug/dL (ref 42–145)
SATURATION RATIOS: 18.8 % — AB (ref 20.0–50.0)
TRANSFERRIN: 280.7 mg/dL (ref 212.0–360.0)

## 2014-08-11 LAB — LIPID PANEL
Cholesterol: 126 mg/dL (ref 0–200)
HDL: 58.6 mg/dL (ref >39.00)
LDL CALC: 58 mg/dL (ref 0–99)
NonHDL: 67.4
Total CHOL/HDL Ratio: 2
Triglycerides: 46 mg/dL (ref 0.0–149.0)
VLDL: 9.2 mg/dL (ref 0.0–40.0)

## 2014-08-11 LAB — BASIC METABOLIC PANEL
BUN: 20 mg/dL (ref 6–23)
CALCIUM: 9.1 mg/dL (ref 8.4–10.5)
CO2: 26 mEq/L (ref 19–32)
Chloride: 106 mEq/L (ref 96–112)
Creatinine, Ser: 0.8 mg/dL (ref 0.4–1.2)
GFR: 70.93 mL/min (ref >60.00)
Glucose, Bld: 88 mg/dL (ref 70–99)
POTASSIUM: 4.4 meq/L (ref 3.5–5.1)
SODIUM: 142 meq/L (ref 135–145)

## 2014-08-11 LAB — HEPATIC FUNCTION PANEL
ALBUMIN: 3.4 g/dL — AB (ref 3.5–5.2)
ALK PHOS: 79 U/L (ref 39–117)
ALT: 16 U/L (ref 0–35)
AST: 27 U/L (ref 0–37)
Bilirubin, Direct: 0.1 mg/dL (ref 0.0–0.3)
TOTAL PROTEIN: 6.4 g/dL (ref 6.0–8.3)
Total Bilirubin: 0.5 mg/dL (ref 0.2–1.2)

## 2014-08-11 LAB — TSH: TSH: 2.04 u[IU]/mL (ref 0.35–4.50)

## 2014-08-11 LAB — HEMOGLOBIN A1C: HEMOGLOBIN A1C: 5.9 % (ref 4.6–6.5)

## 2014-08-11 NOTE — Progress Notes (Signed)
Pre visit review using our clinic review tool, if applicable. No additional management support is needed unless otherwise documented below in the visit note. 

## 2014-08-11 NOTE — Patient Instructions (Addendum)
please consider these measures for your health:  minimize alcohol.  do not use tobacco products.  have a colonoscopy at least every 10 years from age 76.  Women should have an annual mammogram from age 38.  keep firearms safely stored.  always use seat belts.  have working smoke alarms in your home.  see an eye doctor and dentist regularly.  never drive under the influence of alcohol or drugs (including prescription drugs).  those with fair skin should take precautions against the sun.   it is critically important to prevent falling down (keep floor areas well-lit, dry, and free of loose objects.  If you have a cane, walker, or wheelchair, you should use it, even for short trips around the house.  Also, try not to rush).   blood tests are being requested for you today.  We'll contact you with results.   good diet and exercise habits significanly improve your health.  please let me know if you wish to be referred to a dietician.  you should see an eye doctor and dentist every year.  It is very important to get all recommended vaccinations.   Please return in 1 year.   here are some tests for blood in the bowels.  please follow the instructions, and return to the lab.

## 2014-08-11 NOTE — Progress Notes (Signed)
Subjective:    Patient ID: Nicole Hutchinson, female    DOB: 1936/02/13, 78 y.o.   MRN: [id number]  HPI Pt is here for regular wellness examination, and is feeling pretty well in general, and says chronic med probs are stable, except as noted below Past Medical History  Diagnosis Date  . Atrial fibrillation 2008    On Pradaxa  . HTN (hypertension)   . Hyperthyroidism   . Osteoarthritis   . Osteoporosis   . Tubulovillous adenoma of colon 2002  . Dyslipidemia   . Hyperglycemia   . Moderate mitral regurgitation by prior echocardiogram     Normal left ventricular function left atrial enlargement echo 2010    Past Surgical History  Procedure Laterality Date  . Abdominal hysterectomy    . Right hemi-thyroidectomy    . Stress cardiolite  09/20/2003  . Dexa  12/29/2005  . Colon resection      History   Social History  . Marital Status: Single    Spouse Name: N/A    Number of Children: N/A  . Years of Education: N/A   Occupational History  . Retired    Social History Main Topics  . Smoking status: Never Smoker   . Smokeless tobacco: Not on file  . Alcohol Use: No  . Drug Use: No  . Sexual Activity: Not on file   Other Topics Concern  . Not on file   Social History Narrative    Current Outpatient Prescriptions on File Prior to Visit  Medication Sig Dispense Refill  . dabigatran (PRADAXA) 150 MG CAPS capsule Take 1 capsule (150 mg total) by mouth 2 (two) times daily. 180 capsule 2  . fluticasone (FLONASE) 50 MCG/ACT nasal spray USE TWO SPRAY(S) IN EACH NOSTRIL ONCE DAILY 16 g 1  . latanoprost (XALATAN) 0.005 % ophthalmic solution Place 1 drop into both eyes at bedtime. 2.5 mL 12  . losartan-hydrochlorothiazide (HYZAAR) 50-12.5 MG per tablet TAKE ONE TABLET BY MOUTH ONCE DAILY. NEED OFFICE VISIT 90 tablet 2  . lovastatin (MEVACOR) 40 MG tablet TAKE 2 TABLETS (80 MG TOTAL) BY MOUTH AT BEDTIME 180 tablet 2  . zoster vaccine live, PF, (ZOSTAVAX) 93716 UNT/0.65ML  injection Inject 19,400 Units into the skin once. 1 each 0   No current facility-administered medications on file prior to visit.    Allergies  Allergen Reactions  . Ace Inhibitors     REACTION: cough  . Codeine     Family History  Problem Relation Age of Onset  . Cancer Neg Hx     BP 102/64 mmHg  Pulse 59  Temp(Src) 98.2 F (36.8 C) (Oral)  Ht 4\' 6"  (1.372 m)  Wt 112 lb (50.803 kg)  BMI 26.99 kg/m2  SpO2 97%  Review of Systems  Constitutional: Negative for fever and unexpected weight change.  HENT:       No change in chronic hearing loss  Eyes: Negative for visual disturbance.  Respiratory: Negative for shortness of breath.   Cardiovascular: Negative for chest pain.  Gastrointestinal: Negative for anal bleeding.  Endocrine: Negative for cold intolerance.  Genitourinary: Negative for hematuria.  Musculoskeletal: Negative for back pain.  Skin: Negative for rash.  Allergic/Immunologic: Positive for environmental allergies.  Neurological: Negative for syncope and numbness.  Hematological: Bruises/bleeds easily.  Psychiatric/Behavioral: Negative for dysphoric mood.       Objective:   Physical Exam VS: see vs page GEN: no distress HEAD: head: no deformity eyes: no periorbital swelling, no proptosis external nose  and ears are normal mouth: no lesion seen NECK: supple, thyroid is not enlarged CHEST WALL: no deformity LUNGS:  Clear to auscultation BREASTS: sees gyn CV: reg rate and rhythm, no murmur ABD: abdomen is soft, nontender.  no hepatosplenomegaly.  not distended.  no hernia GENITALIA/RECTAL: sees gyn MUSCULOSKELETAL: muscle bulk and strength are grossly normal.  no obvious joint swelling.  gait is normal and steady EXTEMITIES: no deformity.  no ulcer on the feet.  feet are of normal color and temp.  Trace bilat leg edema.  Patchy hyperpigmentation of the legs.  There is bilateral onychomycosis and varicosities.   PULSES: dorsalis pedis intact bilat.  no  carotid bruit.   NEURO:  cn 2-12 grossly intact (except for hearing loss).   readily moves all 4's.  sensation is intact to touch on the feet SKIN:  Normal texture and temperature.  No rash or suspicious lesion is visible.   NODES:  None palpable at the neck PSYCH: alert, well-oriented.  Does not appear anxious nor depressed.      Assessment & Plan:  Wellness visit today, with problems stable, except as noted.      Subjective:   Patient here for Medicare annual wellness visit and management of other chronic and acute problems.     Risk factors: advanced age    57 of Physicians Providing Medical Care to Patient:  See "snapshot"   Activities of Daily Living: In your present state of health, do you have any difficulty performing the following activities?:  Preparing food and eating?: No  Bathing yourself: No  Getting dressed: No  Using the toilet:No  Moving around from place to place: No  In the past year have you fallen or had a near fall?:No    Home Safety: Has smoke detector and wears seat belts. No firearms. No excess sun exposure.  Diet and Exercise  Current exercise habits: pt says good Dietary issues discussed: pt reports a healthy diet   Depression Screen  Q1: Over the past two weeks, have you felt down, depressed or hopeless?no  Q2: Over the past two weeks, have you felt little interest or pleasure in doing things? no   The following portions of the patient's history were reviewed and updated as appropriate: allergies, current medications, past family history, past medical history, past social history, past surgical history and problem list.   Review of Systems  No change in chronic hearing loss; denies visual loss Objective:   Vision:  Sees opthalmologist Hearing: grossly normal Body mass index:  See vs page Msk: pt easily and quickly performs "get-up-and-go" from a sitting position Cognitive Impairment Assessment: cognition, memory and judgment appear  normal.  remembers 3/3 at 5 minutes.  excellent recall.  can easily read and write a sentence.  alert and oriented x 3   Assessment:   Medicare wellness utd on preventive parameters    Plan:   During the course of the visit the patient was educated and counseled about appropriate screening and preventive services including:        Fall prevention   Screening mammography  Bone densitometry screening  Diabetes screening  Nutrition counseling   Vaccines / LABS Zostavax / Pneumococcal Vaccine  today   Patient Instructions (the written plan) was given to the patient.   we discussed code status.  pt requests full code, but would not want to be started or maintained on artificial life-support measures if there was not a reasonable chance of recovery Patient is advised the  following: Patient Instructions  please consider these measures for your health:  minimize alcohol.  do not use tobacco products.  have a colonoscopy at least every 10 years from age 51.  Women should have an annual mammogram from age 17.  keep firearms safely stored.  always use seat belts.  have working smoke alarms in your home.  see an eye doctor and dentist regularly.  never drive under the influence of alcohol or drugs (including prescription drugs).  those with fair skin should take precautions against the sun.   it is critically important to prevent falling down (keep floor areas well-lit, dry, and free of loose objects.  If you have a cane, walker, or wheelchair, you should use it, even for short trips around the house.  Also, try not to rush).   blood tests are being requested for you today.  We'll contact you with results.   good diet and exercise habits significanly improve your health.  please let me know if you wish to be referred to a dietician.  you should see an eye doctor and dentist every year.  It is very important to get all recommended vaccinations.   Please return in 1 year.   here are some tests for  blood in the bowels.  please follow the instructions, and return to the lab.

## 2014-08-12 NOTE — Progress Notes (Signed)
we discussed code status.  pt requests full code, but would not want to be started or maintained on artificial life-support measures if there was not a reasonable chance of recovery 

## 2014-08-14 LAB — PTH, INTACT AND CALCIUM
CALCIUM: 9 mg/dL (ref 8.4–10.5)
PTH: 85 pg/mL — ABNORMAL HIGH (ref 14–64)

## 2014-08-16 ENCOUNTER — Ambulatory Visit (INDEPENDENT_AMBULATORY_CARE_PROVIDER_SITE_OTHER)
Admission: RE | Admit: 2014-08-16 | Discharge: 2014-08-16 | Disposition: A | Discharge status: Home / Self Care | Payer: Commercial Managed Care - HMO | Source: Ambulatory Visit | Attending: Endocrinology | Admitting: Endocrinology

## 2014-08-16 DIAGNOSIS — M810 Age-related osteoporosis without current pathological fracture: Secondary | ICD-10-CM

## 2014-08-17 ENCOUNTER — Other Ambulatory Visit: Payer: Commercial Managed Care - HMO

## 2014-08-23 ENCOUNTER — Encounter: Payer: Self-pay | Admitting: Internal Medicine

## 2014-08-23 ENCOUNTER — Ambulatory Visit (INDEPENDENT_AMBULATORY_CARE_PROVIDER_SITE_OTHER): Payer: Commercial Managed Care - HMO | Admitting: Internal Medicine

## 2014-08-23 VITALS — BP 118/60 | HR 69 | Ht <= 58 in | Wt 110.0 lb

## 2014-08-23 DIAGNOSIS — I480 Paroxysmal atrial fibrillation: Secondary | ICD-10-CM

## 2014-08-23 NOTE — Progress Notes (Signed)
Patient Care Team: Renato Shin, MD as PCP - General Deboraha Sprang, MD (Cardiology) Clent Jacks, MD (Ophthalmology) Inda Castle, MD (Gastroenterology) Darlyn Chamber, MD as Consulting Physician (Obstetrics and Gynecology)   HPI  Nicole Hutchinson is a 78 y.o. female  Seen in followup because of paroxszmal atrial fibrillation and bradycardia. She has mitral regurgitation with moderate left atrial enlargement by echo in the fall with a dimension of 48 mm.  Her thromboembolic risk factors include hypertension gender age and prior thromboembolic episode demonstrated by MRI scanning.   Anticoagulation is with dabigitran ; renal function was normal  November 2015    She notes no symptoms of exercise intolerance lightheadedness shortness of breath or edema  She describes herself as a couch potato  Past Medical History  Diagnosis Date  . Atrial fibrillation 2008    On Pradaxa  . HTN (hypertension)   . Hyperthyroidism   . Osteoarthritis   . Osteoporosis   . Tubulovillous adenoma of colon 2002  . Dyslipidemia   . Hyperglycemia   . Moderate mitral regurgitation by prior echocardiogram     Normal left ventricular function left atrial enlargement echo 2010    Past Surgical History  Procedure Laterality Date  . Abdominal hysterectomy    . Right hemi-thyroidectomy    . Stress cardiolite  09/20/2003  . Dexa  12/29/2005  . Colon resection      Current Outpatient Prescriptions  Medication Sig Dispense Refill  . dabigatran (PRADAXA) 150 MG CAPS capsule Take 1 capsule (150 mg total) by mouth 2 (two) times daily. 180 capsule 2  . fluticasone (FLONASE) 50 MCG/ACT nasal spray USE TWO SPRAY(S) IN EACH NOSTRIL ONCE DAILY 16 g 1  . latanoprost (XALATAN) 0.005 % ophthalmic solution Place 1 drop into both eyes at bedtime. 2.5 mL 12  . losartan-hydrochlorothiazide (HYZAAR) 50-12.5 MG per tablet TAKE ONE TABLET BY MOUTH ONCE DAILY. NEED OFFICE VISIT 90 tablet 2  . lovastatin (MEVACOR) 40 MG  tablet TAKE 2 TABLETS (80 MG TOTAL) BY MOUTH AT BEDTIME 180 tablet 2  . zoster vaccine live, PF, (ZOSTAVAX) 31497 UNT/0.65ML injection Inject 19,400 Units into the skin once. 1 each 0   No current facility-administered medications for this visit.    Allergies  Allergen Reactions  . Ace Inhibitors     REACTION: cough  . Codeine     Review of Systems negative except from HPI and PMH  Physical Exam BP 118/60 mmHg  Pulse 69  Ht 4\' 6"  (1.372 m)  Wt 49.896 kg (110 lb)  BMI 26.51 kg/m2 Well developed and well nourished in no acute distress HENT normal E scleral and icterus clear Neck Supple JVP flat; carotids brisk and full Clear to ausculation  Regular rate and rhythm, no murmurs gallops or rub Soft with active bowel sounds No clubbing cyanosis 1+ Edema Alert and oriented, grossly normal motor and sensory function Skin Warm and Dry  ECG demonstrates Sinus at 69 with variability and likely freq pac 16/12/44  Assessment and  Plan  Atrial fib paroxyxmal  Hypertension  Stable continue current therapies

## 2014-08-23 NOTE — Patient Instructions (Signed)
Your physician recommends that you continue on your current medications as directed. Please refer to the Current Medication list given to you today.  Your physician wants you to follow-up in: 1 year with Dr. Klein.  You will receive a reminder letter in the mail two months in advance. If you don't receive a letter, please call our office to schedule the follow-up appointment.  

## 2014-08-25 ENCOUNTER — Telehealth: Payer: Self-pay | Admitting: *Deleted

## 2014-08-25 NOTE — Telephone Encounter (Signed)
Rose, can you start a PA for the Prolia inj for Ms Callanan? She is Dr Cordelia Pen pt. Thank you!

## 2014-08-28 ENCOUNTER — Other Ambulatory Visit: Payer: Self-pay

## 2014-08-28 DIAGNOSIS — D509 Iron deficiency anemia, unspecified: Secondary | ICD-10-CM

## 2014-08-29 ENCOUNTER — Other Ambulatory Visit (INDEPENDENT_AMBULATORY_CARE_PROVIDER_SITE_OTHER): Payer: Commercial Managed Care - HMO

## 2014-08-29 DIAGNOSIS — D509 Iron deficiency anemia, unspecified: Secondary | ICD-10-CM

## 2014-09-01 LAB — HEMOCCULT SLIDES (X 3 CARDS)
FECAL OCCULT BLD: NEGATIVE
OCCULT 1: NEGATIVE
OCCULT 2: NEGATIVE
OCCULT 3: NEGATIVE
OCCULT 4: NEGATIVE
OCCULT 5: NEGATIVE

## 2014-09-06 NOTE — Telephone Encounter (Signed)
I have electronically sent pt's info for Prolia insurance verification and will notify you once I have a response. Thank you. °

## 2014-09-13 ENCOUNTER — Other Ambulatory Visit: Payer: Self-pay | Admitting: Endocrinology

## 2014-09-27 NOTE — Telephone Encounter (Signed)
[employer] is requiring a prior authorization for pt's Prolia injection.  I have completed as much of the p/a form as possible, but there is some that Dr. Loanne Drilling needs to complete.  I have faxed to your attn.  Once he has completed please return to me via fax at [phone].  Thank you.

## 2014-10-09 ENCOUNTER — Other Ambulatory Visit: Payer: Self-pay | Admitting: Endocrinology

## 2014-10-16 ENCOUNTER — Telehealth: Payer: Self-pay | Admitting: Endocrinology

## 2014-10-16 NOTE — Telephone Encounter (Signed)
Patient like for you to call her. [phone]

## 2014-10-16 NOTE — Telephone Encounter (Signed)
Please read note below. Print please. Thank you.

## 2014-10-16 NOTE — Telephone Encounter (Signed)
Disregard previous note in ref to [facility] referral.  [employer]rolia has faxed a note stating they have confirmed w/[employer] there is a prior authorization already on file for Prolia.  P/A #[id number] is valid from 10/06/2014-10/10/2015, # of injections is unlimited. Pt's estimated responsibility for Prolia and admin w/out an OV is 20% co-insurance, which is approximately $180; w/an OV there will be an additional $45 co-pay plus the 20% co-insurance for a total estimated responsibility of $225.  I have sent a copy of the summary of benefits and the note from [employer] w/the P/A info to be scanned into pt's chart. Please be aware if pt cannot afford $180-$225 for an injection, she can call [phone] to see if she qualifies for any of [employer]'s assistance programs.  If she qualifies, they will instruct her how to proceed.  If you have any questions, please let me know. Thank you.

## 2014-10-16 NOTE — Telephone Encounter (Signed)
2000 units per day

## 2014-10-16 NOTE — Telephone Encounter (Signed)
ok 

## 2014-10-16 NOTE — Telephone Encounter (Signed)
Spoke with pt's sister. Prolia injections will cost the pt between 180-225$. Pt contacted pt's assistance and they will not be able to to provide any coverage for the medication. The pt is not in a place financially where she can afford this. Wanted to know if she could start taking vit-D for a year and see what the results are at the next bone density.  Please advise, Thanks!

## 2014-10-16 NOTE — Telephone Encounter (Signed)
Pt wanted to if there is a specific dosage of the Vit-D she should be taking.  Please advise, Thanks!

## 2014-10-16 NOTE — Telephone Encounter (Addendum)
Contacted pt's sister and advised of note below. Pt will call assistance number to see if she qualifies. Pt will call office back to let us know how to proceed.

## 2014-10-17 NOTE — Telephone Encounter (Signed)
Unable to reach pt will try again at a later time.  

## 2014-10-17 NOTE — Telephone Encounter (Signed)
Requested call back to discuss.  

## 2014-10-17 NOTE — Telephone Encounter (Signed)
Pt's sister advised of note below and voiced understanding.

## 2015-01-01 ENCOUNTER — Encounter: Payer: Self-pay | Admitting: Gastroenterology

## 2015-01-08 ENCOUNTER — Other Ambulatory Visit: Payer: Self-pay | Admitting: Endocrinology

## 2015-03-06 ENCOUNTER — Other Ambulatory Visit: Payer: Self-pay | Admitting: Endocrinology

## 2015-03-20 ENCOUNTER — Other Ambulatory Visit: Payer: Self-pay | Admitting: Endocrinology

## 2015-03-20 ENCOUNTER — Encounter: Payer: Self-pay | Admitting: Gastroenterology

## 2015-03-20 DIAGNOSIS — Z1231 Encounter for screening mammogram for malignant neoplasm of breast: Secondary | ICD-10-CM

## 2015-04-04 ENCOUNTER — Other Ambulatory Visit: Payer: Self-pay | Admitting: Endocrinology

## 2015-04-12 ENCOUNTER — Ambulatory Visit: Payer: Commercial Managed Care - HMO

## 2015-04-16 ENCOUNTER — Ambulatory Visit
Admission: RE | Admit: 2015-04-16 | Discharge: 2015-04-16 | Disposition: A | Discharge status: Home / Self Care | Payer: Commercial Managed Care - HMO | Source: Ambulatory Visit | Attending: Endocrinology | Admitting: Endocrinology

## 2015-04-16 DIAGNOSIS — Z1231 Encounter for screening mammogram for malignant neoplasm of breast: Secondary | ICD-10-CM

## 2015-04-25 ENCOUNTER — Encounter: Payer: Self-pay | Admitting: Gastroenterology

## 2015-05-15 ENCOUNTER — Telehealth: Payer: Self-pay | Admitting: Endocrinology

## 2015-05-15 DIAGNOSIS — H409 Unspecified glaucoma: Secondary | ICD-10-CM

## 2015-05-15 DIAGNOSIS — Z1211 Encounter for screening for malignant neoplasm of colon: Secondary | ICD-10-CM

## 2015-05-15 DIAGNOSIS — Z0000 Encounter for general adult medical examination without abnormal findings: Secondary | ICD-10-CM | POA: Insufficient documentation

## 2015-05-15 NOTE — Telephone Encounter (Signed)
done

## 2015-05-15 NOTE — Telephone Encounter (Addendum)
Patient called stating that she is having a procedure at [facility] and would need authorization and a referral sent to Dr. Deatra Ina office  Also, please send a referral to [facility] care  Please advise    Thank you

## 2015-05-15 NOTE — Telephone Encounter (Signed)
See note below and please advise, Thanks! 

## 2015-05-15 NOTE — Telephone Encounter (Signed)
See notes below to be advised about referral's that were placed.

## 2015-05-16 ENCOUNTER — Ambulatory Visit: Payer: Commercial Managed Care - HMO

## 2015-05-22 ENCOUNTER — Telehealth: Payer: Self-pay | Admitting: Endocrinology

## 2015-05-22 NOTE — Telephone Encounter (Signed)
Pt needs [employer] gold referral for endoscopy

## 2015-05-28 ENCOUNTER — Encounter: Payer: Self-pay | Admitting: Gastroenterology

## 2015-05-28 ENCOUNTER — Ambulatory Visit (INDEPENDENT_AMBULATORY_CARE_PROVIDER_SITE_OTHER): Payer: Commercial Managed Care - HMO | Admitting: Gastroenterology

## 2015-05-28 VITALS — BP 132/68 | HR 76 | Ht <= 58 in | Wt 105.4 lb

## 2015-05-28 DIAGNOSIS — Z860100 Personal history of colon polyps, unspecified: Secondary | ICD-10-CM

## 2015-05-28 MED ORDER — NA SULFATE-K SULFATE-MG SULF 17.5-3.13-1.6 GM/177ML PO SOLN: 1.0000 | Freq: Once | ORAL | Status: DC

## 2015-05-28 NOTE — Progress Notes (Signed)
_                                                                                                                History of Present Illness:  Nicole Hutchinson is a 79 year old white female with history of atrial fibrillation, on Pradaxa, colon polyps, referred at the request of Dr. Loanne Drilling for follow-up colonoscopy.  An adenomatous polyp was removed at last colonoscopy in 2011.  She has no GI complaints.   Past Medical History  Diagnosis Date  . Atrial fibrillation 2008    On Pradaxa  . HTN (hypertension)   . Hyperthyroidism   . Osteoarthritis   . Osteoporosis   . Tubulovillous adenoma of colon 2002  . Dyslipidemia   . Hyperglycemia   . Moderate mitral regurgitation by prior echocardiogram     Normal left ventricular function left atrial enlargement echo 2010   Past Surgical History  Procedure Laterality Date  . Abdominal hysterectomy    . Right hemi-thyroidectomy    . Stress cardiolite  09/20/2003  . Dexa  12/29/2005  . Colon resection  2006   family history includes Lung cancer in her paternal aunt and paternal uncle; Pancreatic cancer in her paternal uncle. Current Outpatient Prescriptions  Medication Sig Dispense Refill  . cholecalciferol (VITAMIN D) 1000 UNITS tablet Take 1,000 Units by mouth 2 (two) times daily after a meal.    . fluticasone (FLONASE) 50 MCG/ACT nasal spray USE TWO SPRAY(S) IN EACH NOSTRIL ONCE DAILY 16 g 2  . latanoprost (XALATAN) 0.005 % ophthalmic solution INSTILL ONE DROP INTO EACH EYE AT BEDTIME 3 mL 2  . losartan-hydrochlorothiazide (HYZAAR) 50-12.5 MG per tablet TAKE ONE TABLET BY MOUTH ONCE DAILY. 90 tablet 0  . lovastatin (MEVACOR) 40 MG tablet TAKE TWO TABLETS BY MOUTH AT BEDTIME. 180 tablet 0  . PRADAXA 150 MG CAPS capsule TAKE ONE CAPSULE BY MOUTH TWICE DAILY. 180 capsule 0   No current facility-administered medications for this visit.   Allergies as of 05/28/2015 - Review Complete 05/28/2015  Allergen Reaction Noted  . Ace  inhibitors  04/30/2009  . Codeine      reports that she has never smoked. She has never used smokeless tobacco. She reports that she does not drink alcohol or use illicit drugs.   Review of Systems: Pertinent positive and negative review of systems were noted in the above HPI section. All other review of systems were otherwise negative.  Vital signs were reviewed in today's medical record Physical Exam: General: Elderly female in no acute distress Skin: anicteric Head: Normocephalic and atraumatic Eyes:  sclerae anicteric, EOMI Ears: Normal auditory acuity Mouth: No deformity or lesions Neck: Supple, no masses or thyromegaly Lymph Nodes: no lymphadenopathy Lungs: Clear throughout to auscultation Heart: Regular rate and rhythm; no murmurs, rubs or bruits Gastroinestinal: Soft, non tender and non distended. No masses, hepatosplenomegaly or hernias noted. Normal Bowel sounds Rectal:deferred Musculoskeletal: Symmetrical with no gross deformities  Skin: No lesions on visible extremities Pulses:  Normal pulses noted Extremities: No  clubbing, cyanosis,  or deformities noted.  There is 1+ pedal edema Neurological: Alert oriented x 4, grossly nonfocal Cervical Nodes:  No significant cervical adenopathy Inguinal Nodes: No significant inguinal adenopathy Psychological:  Alert and cooperative. Normal mood and affect  See Assessment and Plan under Problem List

## 2015-05-28 NOTE — Patient Instructions (Signed)

## 2015-05-28 NOTE — Assessment & Plan Note (Addendum)
Plan colonoscopy.  The risk of holding anticoagulation therapy or antiplatelet medications was discussed including the increased risk for thromboembolic disease that may include DVT, pulmonary emboli and stroke. The patient understands this risk and is willing to proceed with temporally holding the medication provided that this is approved by her PCP or cardiologist.  CC Dr. Loanne Drilling

## 2015-05-29 ENCOUNTER — Encounter: Payer: Self-pay | Admitting: Gastroenterology

## 2015-05-29 ENCOUNTER — Telehealth: Payer: Self-pay | Admitting: *Deleted

## 2015-05-29 NOTE — Telephone Encounter (Signed)
  05/29/2015   RE: Nicole Hutchinson DOB: [birth year] MRN: [id number]   Dear  Herbert Pun    We have scheduled the above patient for an endoscopic procedure. Our records show that she is on anticoagulation therapy.   Please advise as to how long the patient may come off her therapy of Pradaxa  prior to the procedure, which is scheduled for 07/31/2015  Please fax back/ or route the completed form to [facility] at [phone].   Sincerely,    Genella Mech

## 2015-05-30 ENCOUNTER — Other Ambulatory Visit: Payer: Self-pay | Admitting: Endocrinology

## 2015-06-03 NOTE — Telephone Encounter (Signed)
She has hi9story of prior thromboembolism  If her need for Colonoscopy is HIGH then it should be done with minimal holding of anticoagulation ie NO DOSES the day prior to procedure  Renal func9tion is normal

## 2015-06-04 NOTE — Telephone Encounter (Signed)
Dr Deatra Ina, Please review this

## 2015-06-05 NOTE — Telephone Encounter (Signed)
Ok to schedule while holding pradaxa the day prior and the day of procedure

## 2015-06-08 NOTE — Telephone Encounter (Signed)
Dr Deatra Ina, I Called patient and she wanted to inform you that her heart is out of rhythm and not sure if she should have the procedure. Wants to know what you think?? Please route this message back to [facility] because Im on vacation next week. Thank You

## 2015-06-08 NOTE — Telephone Encounter (Signed)
Please contact pt's cardiologist to determine whether it is safe to proceed with elective colonoscopy.

## 2015-06-12 NOTE — Telephone Encounter (Signed)
No answer at the patient's home. Left a message to call me. Want to clarify "out of rhythm" before calling the cardiologist. He has not seen her since 08/23/14.

## 2015-06-12 NOTE — Telephone Encounter (Signed)
Spoke with the patient. She denies any symptoms of chest pain, discomfort, shortness of breath or cough. Please confirm she is cleared to undergo a colonoscopy using Propofol.

## 2015-06-19 NOTE — Telephone Encounter (Signed)
The presence of atrial fib does not impace her colonoscopy

## 2015-06-19 NOTE — Telephone Encounter (Signed)
Called patient to inform her per Dr Caryl Comes said she was ok to continue with having her colonoscopy, the presence of AFIB does not impact her colonoscopy

## 2015-07-02 ENCOUNTER — Other Ambulatory Visit: Payer: Self-pay | Admitting: Endocrinology

## 2015-07-12 ENCOUNTER — Telehealth: Payer: Self-pay | Admitting: Gastroenterology

## 2015-07-12 NOTE — Telephone Encounter (Signed)
Alyse Low, could you take it from here?

## 2015-07-12 NOTE — Telephone Encounter (Signed)
Spoke with the patient. Message forwarded to Dr Henrene Pastor.

## 2015-07-12 NOTE — Telephone Encounter (Signed)
Okay 

## 2015-07-12 NOTE — Telephone Encounter (Signed)
Left message for patient to call back.yesi

## 2015-07-31 ENCOUNTER — Encounter: Payer: Commercial Managed Care - HMO | Admitting: Gastroenterology

## 2015-08-16 ENCOUNTER — Ambulatory Visit (INDEPENDENT_AMBULATORY_CARE_PROVIDER_SITE_OTHER): Payer: Commercial Managed Care - HMO | Admitting: Endocrinology

## 2015-08-16 ENCOUNTER — Encounter: Payer: Self-pay | Admitting: Endocrinology

## 2015-08-16 VITALS — BP 132/82 | HR 105 | Temp 98.3°F | Wt 101.0 lb

## 2015-08-16 DIAGNOSIS — E7800 Pure hypercholesterolemia, unspecified: Secondary | ICD-10-CM | POA: Diagnosis not present

## 2015-08-16 DIAGNOSIS — I10 Essential (primary) hypertension: Secondary | ICD-10-CM | POA: Diagnosis not present

## 2015-08-16 DIAGNOSIS — Z23 Encounter for immunization: Secondary | ICD-10-CM

## 2015-08-16 DIAGNOSIS — M810 Age-related osteoporosis without current pathological fracture: Secondary | ICD-10-CM

## 2015-08-16 DIAGNOSIS — I480 Paroxysmal atrial fibrillation: Secondary | ICD-10-CM | POA: Diagnosis not present

## 2015-08-16 DIAGNOSIS — R739 Hyperglycemia, unspecified: Secondary | ICD-10-CM

## 2015-08-16 LAB — CBC WITH DIFFERENTIAL/PLATELET
Basophils Absolute: 0.1 10*3/uL (ref 0.0–0.1)
Basophils Relative: 0.6 % (ref 0.0–3.0)
Eosinophils Absolute: 0 10*3/uL (ref 0.0–0.7)
Eosinophils Relative: 0 % (ref 0.0–5.0)
HEMATOCRIT: 39 % (ref 36.0–46.0)
HEMOGLOBIN: 13.1 g/dL (ref 12.0–15.0)
LYMPHS ABS: 0.9 10*3/uL (ref 0.7–4.0)
Lymphocytes Relative: 10.6 % — ABNORMAL LOW (ref 12.0–46.0)
MCHC: 33.7 g/dL (ref 30.0–36.0)
MCV: 90.4 fl (ref 78.0–100.0)
MONO ABS: 0.9 10*3/uL (ref 0.1–1.0)
Monocytes Relative: 10.3 % (ref 3.0–12.0)
Neutro Abs: 6.8 10*3/uL (ref 1.4–7.7)
Neutrophils Relative %: 78.5 % — ABNORMAL HIGH (ref 43.0–77.0)
PLATELETS: 218 10*3/uL (ref 150.0–400.0)
RBC: 4.32 Mil/uL (ref 3.87–5.11)
RDW: 14.5 % (ref 11.5–15.5)
WBC: 8.7 10*3/uL (ref 4.0–10.5)

## 2015-08-16 LAB — BASIC METABOLIC PANEL
BUN: 23 mg/dL (ref 6–23)
CHLORIDE: 102 meq/L (ref 96–112)
CO2: 31 mEq/L (ref 19–32)
CREATININE: 0.85 mg/dL (ref 0.40–1.20)
Calcium: 9.7 mg/dL (ref 8.4–10.5)
GFR: 68.82 mL/min (ref >60.00)
GLUCOSE: 94 mg/dL (ref 70–99)
POTASSIUM: 4.4 meq/L (ref 3.5–5.1)
Sodium: 142 mEq/L (ref 135–145)

## 2015-08-16 LAB — LIPID PANEL
CHOL/HDL RATIO: 2
CHOLESTEROL: 145 mg/dL (ref 0–200)
HDL: 75.8 mg/dL (ref >39.00)
LDL CALC: 63 mg/dL (ref 0–99)
NonHDL: 69.22
TRIGLYCERIDES: 32 mg/dL (ref 0.0–149.0)
VLDL: 6.4 mg/dL (ref 0.0–40.0)

## 2015-08-16 LAB — URINALYSIS, ROUTINE W REFLEX MICROSCOPIC
BILIRUBIN URINE: NEGATIVE
Ketones, ur: NEGATIVE
Nitrite: NEGATIVE
PH: 6.5 (ref 5.0–8.0)
RBC / HPF: NONE SEEN (ref 0–?)
SPECIFIC GRAVITY, URINE: 1.015 (ref 1.000–1.030)
Total Protein, Urine: NEGATIVE
Urine Glucose: NEGATIVE
Urobilinogen, UA: 1 (ref 0.0–1.0)

## 2015-08-16 LAB — HEMOGLOBIN A1C: HEMOGLOBIN A1C: 5.6 % (ref 4.6–6.5)

## 2015-08-16 LAB — TSH: TSH: 2.41 u[IU]/mL (ref 0.35–4.50)

## 2015-08-16 LAB — HEPATIC FUNCTION PANEL
ALBUMIN: 4 g/dL (ref 3.5–5.2)
ALK PHOS: 76 U/L (ref 39–117)
ALT: 14 U/L (ref 0–35)
AST: 23 U/L (ref 0–37)
Bilirubin, Direct: 0.2 mg/dL (ref 0.0–0.3)
TOTAL PROTEIN: 6.7 g/dL (ref 6.0–8.3)
Total Bilirubin: 0.7 mg/dL (ref 0.2–1.2)

## 2015-08-16 NOTE — Patient Instructions (Addendum)
please consider these measures for your health:  minimize alcohol.  do not use tobacco products.  have a colonoscopy at least every 10 years from age 79.  Women should have an annual mammogram from age 76.  keep firearms safely stored.  always use seat belts.  have working smoke alarms in your home.  see an eye doctor and dentist regularly.  never drive under the influence of alcohol or drugs (including prescription drugs).  those with fair skin should take precautions against the sun.blood tests are requested for you today.  We'll let you know about the results. it is critically important to prevent falling down (keep floor areas well-lit, dry, and free of loose objects.  If you have a cane, walker, or wheelchair, you should use it, even for short trips around the house.  Also, try not to rush).   Please return in 1 year.

## 2015-08-16 NOTE — Progress Notes (Signed)
Subjective:    Patient ID: Nicole Hutchinson, female    DOB: 1936-05-06, 79 y.o.   MRN: [id number]  HPI Pt is here for regular wellness examination, and is feeling pretty well in general, and says chronic med probs are stable, except as noted below Past Medical History  Diagnosis Date  . Atrial fibrillation ([place]) 2008    On Pradaxa  . HTN (hypertension)   . Hyperthyroidism   . Osteoarthritis   . Osteoporosis   . Tubulovillous adenoma of colon 2002  . Dyslipidemia   . Hyperglycemia   . Moderate mitral regurgitation by prior echocardiogram     Normal left ventricular function left atrial enlargement echo 2010    Past Surgical History  Procedure Laterality Date  . Abdominal hysterectomy    . Right hemi-thyroidectomy    . Stress cardiolite  09/20/2003  . Dexa  12/29/2005  . Colon resection  2006    Social History   Social History  . Marital Status: Single    Spouse Name: N/A  . Number of Children: N/A  . Years of Education: N/A   Occupational History  . Retired    Social History Main Topics  . Smoking status: Never Smoker   . Smokeless tobacco: Never Used  . Alcohol Use: No  . Drug Use: No  . Sexual Activity: Not on file   Other Topics Concern  . Not on file   Social History Narrative    Current Outpatient Prescriptions on File Prior to Visit  Medication Sig Dispense Refill  . cholecalciferol (VITAMIN D) 1000 UNITS tablet Take 1,000 Units by mouth 2 (two) times daily after a meal.    . fluticasone (FLONASE) 50 MCG/ACT nasal spray USE TWO SPRAY(S) IN EACH NOSTRIL ONCE DAILY 16 g 0  . latanoprost (XALATAN) 0.005 % ophthalmic solution INSTILL ONE DROP INTO EACH EYE AT BEDTIME 3 mL 2  . losartan-hydrochlorothiazide (HYZAAR) 50-12.5 MG per tablet TAKE ONE TABLET BY MOUTH ONCE DAILY. 90 tablet 0  . lovastatin (MEVACOR) 40 MG tablet TAKE TWO TABLETS BY MOUTH AT BEDTIME 180 tablet 0  . Na Sulfate-K Sulfate-Mg Sulf (SUPREP BOWEL PREP) SOLN Take 1 kit by mouth once. 1  Bottle 0  . PRADAXA 150 MG CAPS capsule TAKE ONE CAPSULE BY MOUTH TWICE DAILY 180 capsule 0   No current facility-administered medications on file prior to visit.    Allergies  Allergen Reactions  . Ace Inhibitors     REACTION: cough  . Codeine     Family History  Problem Relation Age of Onset  . Lung cancer Paternal Uncle   . Pancreatic cancer Paternal Uncle   . Lung cancer Paternal Aunt     BP 132/82 mmHg  Pulse 105  Temp(Src) 98.3 F (36.8 C) (Oral)  Wt 101 lb (45.813 kg)  SpO2 91%  Review of Systems  Constitutional: Negative for fever and unexpected weight change.  HENT: Negative for hearing loss.   Eyes: Negative for visual disturbance.  Gastrointestinal: Negative for blood in stool.  Endocrine: Negative for cold intolerance.  Genitourinary: Negative for hematuria.  Musculoskeletal: Negative for back pain.  Skin: Negative for rash.  Allergic/Immunologic: Positive for environmental allergies.  Neurological: Negative for syncope.  Psychiatric/Behavioral: Negative for dysphoric mood.       Objective:   Physical Exam VS: see vs page GEN: no distress HEAD: head: no deformity eyes: no periorbital swelling, no proptosis external nose and ears are normal mouth: no lesion seen NECK: a healed scar  is present.  i do not appreciate a nodule in the thyroid or elsewhere in the neck CHEST WALL: no deformity.   LUNGS:  Clear to auscultation.   CV: reg rate and rhythm, no murmur.   ABD: abdomen is soft, nontender.  no hepatosplenomegaly.  not distended.  no hernia.   MUSCULOSKELETAL: muscle bulk and strength are grossly normal.  no obvious joint swelling.  gait is normal and steady EXTEMITIES: no deformity.  no ulcer on the feet.  feet are of normal temp.  1+ bilat leg edema.  There is bilateral onychomycosis of the toenails.   PULSES: dorsalis pedis intact bilat.  no carotid bruit NEURO:  cn 2-12 grossly intact.   readily moves all 4's.  sensation is intact to touch on  the feet SKIN:  Normal texture and temperature.  No rash or suspicious lesion is visible.  Patchy hyperpigmentation on the legs. NODES:  None palpable at the neck PSYCH: alert, well-oriented.  Does not appear anxious nor depressed.      Assessment & Plan:  Wellness visit today, with problems stable, except as noted.     Subjective:   Patient here for Medicare annual wellness visit and management of other chronic and acute problems.     Risk factors: advanced age    37 of Physicians Providing Medical Care to Patient:  See "snapshot"   Activities of Daily Living: In your present state of health, do you have any difficulty performing the following activities?:  Preparing food and eating?: No  Bathing yourself: No  Getting dressed: No  Using the toilet:No  Moving around from place to place: No  In the past year have you fallen or had a near fall?: No    Home Safety: Has smoke detector and wears seat belts. No firearms. No excess sun exposure.  Diet and Exercise  Current exercise habits: pt says good, as limited by advanced age Dietary issues discussed: pt reports a healthy diet   Depression Screen  Q1: Over the past two weeks, have you felt down, depressed or hopeless?no  Q2: Over the past two weeks, have you felt little interest or pleasure in doing things? no   The following portions of the patient's history were reviewed and updated as appropriate: allergies, current medications, past family history, past medical history, past social history, past surgical history and problem list.   Review of Systems  Denies hearing loss, and visual loss Objective:   Vision:  Sees opthalmologist Hearing: grossly normal Body mass index:  See vs page Msk: pt easily and quickly performs "get-up-and-go" from a sitting position Cognitive Impairment Assessment: cognition, memory and judgment appear normal.  remembers 3/3 at 5 minutes.  excellent recall.  can easily read and write a  sentence.  alert and oriented x 3   Assessment:   Medicare wellness utd on preventive parameters    Plan:   During the course of the visit the patient was educated and counseled about appropriate screening and preventive services including:       Fall prevention   Screening mammography  Bone densitometry screening  Diabetes screening  Nutrition counseling   Vaccines / LABS Vaccines are up to date today   Patient Instructions (the written plan) was given to the patient.

## 2015-08-16 NOTE — Progress Notes (Signed)
we discussed code status.  pt requests full code, but would not want to be started or maintained on artificial life-support measures if there was not a reasonable chance of recovery 

## 2015-09-11 ENCOUNTER — Telehealth: Payer: Self-pay | Admitting: Internal Medicine

## 2015-09-13 ENCOUNTER — Telehealth: Payer: Self-pay

## 2015-09-13 NOTE — Telephone Encounter (Signed)
-----   Message from Irene Shipper, MD sent at 09/12/2015  4:02 PM EST ----- Yes, still the case. Thanks for checking ----- Message -----    From: Audrea Muscat, CMA    Sent: 09/12/2015   3:16 PM      To: Irene Shipper, MD  Patient was a Nicole Hutchinson patient who transferred care to you and has a procedure scheduled for 09/19/2015.  Dr. Caryl Comes stated in September that she could hold her Pradaxa the day before and the day of the procedure - see phone note dated 05/29/2015.  Would that still be accurate even though the instruction was given back in September?  Just want to make sure.  Thanks

## 2015-09-13 NOTE — Telephone Encounter (Signed)
Spoke with patient's sister and clarified that she should hold her Pradaxa the day before and the day of the procedure.  She acknowledged and understood

## 2015-09-17 ENCOUNTER — Other Ambulatory Visit: Payer: Self-pay | Admitting: Endocrinology

## 2015-09-17 DIAGNOSIS — I480 Paroxysmal atrial fibrillation: Secondary | ICD-10-CM

## 2015-09-19 ENCOUNTER — Encounter: Payer: Self-pay | Admitting: Internal Medicine

## 2015-09-19 ENCOUNTER — Ambulatory Visit (AMBULATORY_SURGERY_CENTER): Payer: Commercial Managed Care - HMO | Admitting: Internal Medicine

## 2015-09-19 VITALS — BP 135/53 | HR 81 | Temp 98.7°F | Resp 23 | Ht <= 58 in | Wt 105.0 lb

## 2015-09-19 DIAGNOSIS — D120 Benign neoplasm of cecum: Secondary | ICD-10-CM

## 2015-09-19 DIAGNOSIS — D122 Benign neoplasm of ascending colon: Secondary | ICD-10-CM

## 2015-09-19 DIAGNOSIS — Z860100 Personal history of colon polyps, unspecified: Secondary | ICD-10-CM

## 2015-09-19 DIAGNOSIS — D124 Benign neoplasm of descending colon: Secondary | ICD-10-CM | POA: Diagnosis not present

## 2015-09-19 MED ORDER — SODIUM CHLORIDE 0.9 % IV SOLN: 500.0000 mL | INTRAVENOUS | Status: DC

## 2015-09-19 NOTE — Progress Notes (Signed)
Report to PACU, RN, vss, BBS= Clear.  

## 2015-09-19 NOTE — Patient Instructions (Signed)
Discharge instructions given. Resume Pradaxa today. Resume previous medications today. YOU HAD AN ENDOSCOPIC PROCEDURE TODAY AT [facility]:   Refer to the procedure report that was given to you for any specific questions about what was found during the examination.  If the procedure report does not answer your questions, please call your gastroenterologist to clarify.  If you requested that your care partner not be given the details of your procedure findings, then the procedure report has been included in a sealed envelope for you to review at your convenience later.  YOU SHOULD EXPECT: Some feelings of bloating in the abdomen. Passage of more gas than usual.  Walking can help get rid of the air that was put into your GI tract during the procedure and reduce the bloating. If you had a lower endoscopy (such as a colonoscopy or flexible sigmoidoscopy) you may notice spotting of blood in your stool or on the toilet paper. If you underwent a bowel prep for your procedure, you may not have a normal bowel movement for a few days.  Please Note:  You might notice some irritation and congestion in your nose or some drainage.  This is from the oxygen used during your procedure.  There is no need for concern and it should clear up in a day or so.  SYMPTOMS TO REPORT IMMEDIATELY:   Following lower endoscopy (colonoscopy or flexible sigmoidoscopy):  Excessive amounts of blood in the stool  Significant tenderness or worsening of abdominal pains  Swelling of the abdomen that is new, acute  Fever of 100F or higher   For urgent or emergent issues, a gastroenterologist can be reached at any hour by calling [phone].   DIET: Your first meal following the procedure should be a small meal and then it is ok to progress to your normal diet. Heavy or fried foods are harder to digest and may make you feel nauseous or bloated.  Likewise, meals heavy in dairy and vegetables can increase  bloating.  Drink plenty of fluids but you should avoid alcoholic beverages for 24 hours.  ACTIVITY:  You should plan to take it easy for the rest of today and you should NOT DRIVE or use heavy machinery until tomorrow (because of the sedation medicines used during the test).    FOLLOW UP: Our staff will call the number listed on your records the next business day following your procedure to check on you and address any questions or concerns that you may have regarding the information given to you following your procedure. If we do not reach you, we will leave a message.  However, if you are feeling well and you are not experiencing any problems, there is no need to return our call.  We will assume that you have returned to your regular daily activities without incident.  If any biopsies were taken you will be contacted by phone or by letter within the next 1-3 weeks.  Please call us at [phone] if you have not heard about the biopsies in 3 weeks.    SIGNATURES/CONFIDENTIALITY: You and/or your care partner have signed paperwork which will be entered into your electronic medical record.  These signatures attest to the fact that that the information above on your After Visit Summary has been reviewed and is understood.  Full responsibility of the confidentiality of this discharge information lies with you and/or your care-partner.

## 2015-09-19 NOTE — Progress Notes (Signed)
Called to room to assist during endoscopic procedure.  Patient ID and intended procedure confirmed with present staff. Received instructions for my participation in the procedure from the performing physician.  

## 2015-09-20 ENCOUNTER — Other Ambulatory Visit: Payer: Self-pay | Admitting: Endocrinology

## 2015-09-20 ENCOUNTER — Telehealth: Payer: Self-pay | Admitting: *Deleted

## 2015-09-20 NOTE — Op Note (Signed)
[place]  [address] [place], [id number]   COLONOSCOPY PROCEDURE REPORT  PATIENT: Nicole Hutchinson, Nicole Hutchinson  MR#: [id number] BIRTHDATE: 07/10/1936 , 50  yrs. old GENDER: female ENDOSCOPIST: Eustace Quail, MD REFERRED [id number] Program Recall PROCEDURE DATE:  09/19/2015 PROCEDURE:   Colonoscopy, surveillance and Colonoscopy with snare polypectomy x 6 First Screening Colonoscopy - Avg.  risk and is 50 yrs.  old or older - No.  Prior Negative Screening - Now for repeat screening. N/A  History of Adenoma - Now for follow-up colonoscopy & has been > or = to 3 yrs.  Yes hx of adenoma.  Has been 3 or more years since last colonoscopy.  Polyps removed today? Yes ASA CLASS:   Class III INDICATIONS:Surveillance due to prior colonic neoplasia and PH Colon Adenoma.  . Large carpeted tubulovillous adenoma from the rectosigmoid region surgically resected 2002. Most recent surveillance exam May 2011 with small tubular adenoma (Dr. Deatra Ina) MEDICATIONS: Monitored anesthesia care and Propofol 140 mg IV  DESCRIPTION OF PROCEDURE:   After the risks benefits and alternatives of the procedure were thoroughly explained, informed consent was obtained.  The digital rectal exam revealed no abnormalities of the rectum.   The LB [id number]  endoscope was introduced through the anus and advanced to the cecum, which was identified by both the appendix and ileocecal valve. No adverse events experienced.   The quality of the prep was excellent. (Suprep was used)  The instrument was then slowly withdrawn as the colon was fully examined. Estimated blood loss is zero unless otherwise noted in this procedure report.  COLON FINDINGS: Six polyps ranging between 3-12mm in size were found at the cecum, in the ascending colon, and descending colon.  A polypectomy was performed with a cold snare.  The resection was complete, the polyp tissue was completely retrieved and sent to histology.    There was evidence of a prior colorectal surgical anastomosis.   The examination was otherwise normal.  Retroflexed views revealed no abnormalities. The time to cecum = 4.2 Withdrawal time = 12.0   The scope was withdrawn and the procedure completed. COMPLICATIONS: There were no immediate complications.  ENDOSCOPIC IMPRESSION: 1.   Six small polyps were found  in the colon; polypectomy was performed with a cold snare 2.   There was evidence of a prior colorectal surgical anastomosis 3.   The examination was otherwise normal  RECOMMENDATIONS: 1.  Resume Pradaxa today 2.  Return to the care of your primary provider.  GI follow up as needed  eSigned:  Eustace Quail, MD 09/19/2015 1:48 PM   cc: The Patient and Donavan Foil, MD

## 2015-09-20 NOTE — Telephone Encounter (Signed)
  Follow up Call-  Call back number 09/19/2015  Post procedure Call Back phone  # [phone]  Permission to leave phone message Yes     Patient questions:  Do you have a fever, pain , or abdominal swelling? No. Pain Score  0 *  Have you tolerated food without any problems? Yes.    Have you been able to return to your normal activities? Yes.    Do you have any questions about your discharge instructions: Diet   No. Medications  No. Follow up visit  No.  Do you have questions or concerns about your Care? No.  Actions: * If pain score is 4 or above: No action needed, pain <4.

## 2015-09-21 ENCOUNTER — Ambulatory Visit (INDEPENDENT_AMBULATORY_CARE_PROVIDER_SITE_OTHER): Payer: Commercial Managed Care - HMO | Admitting: Internal Medicine

## 2015-09-21 ENCOUNTER — Encounter: Payer: Self-pay | Admitting: Internal Medicine

## 2015-09-21 VITALS — BP 132/63 | HR 82 | Ht <= 58 in | Wt 103.8 lb

## 2015-09-21 DIAGNOSIS — I480 Paroxysmal atrial fibrillation: Secondary | ICD-10-CM | POA: Diagnosis not present

## 2015-09-21 NOTE — Progress Notes (Signed)
Patient Care Team: Renato Shin, MD as PCP - General Deboraha Sprang, MD (Cardiology) Clent Jacks, MD (Ophthalmology) Inda Castle, MD (Gastroenterology) Arvella Nigh, MD as Consulting Physician (Obstetrics and Gynecology)   HPI  Nicole Hutchinson is a 79 y.o. female  Seen in followup because of paroxszmal atrial fibrillation and bradycardia. She has mitral regurgitation with moderate left atrial enlargement by echo in the fall with a dimension of 48 mm.  Her thromboembolic risk factors include hypertension gender age and prior thromboembolic episode demonstrated by MRI scanning.   Anticoagulation is with dabigitran ; renal function was normal  November 2016; K also    She notes no symptoms of exercise intolerance lightheadedness shortness of breath or edema  She describes herself as a couch potato  Past Medical History  Diagnosis Date  . Atrial fibrillation ([place]) 2008    On Pradaxa  . HTN (hypertension)   . Hyperthyroidism   . Osteoarthritis   . Osteoporosis   . Tubulovillous adenoma of colon 2002  . Dyslipidemia   . Hyperglycemia   . Moderate mitral regurgitation by prior echocardiogram     Normal left ventricular function left atrial enlargement echo 2010  . Stroke [facility])     Past Surgical History  Procedure Laterality Date  . Abdominal hysterectomy    . Right hemi-thyroidectomy    . Stress cardiolite  09/20/2003  . Dexa  12/29/2005  . Colon resection  2006    Current Outpatient Prescriptions  Medication Sig Dispense Refill  . cholecalciferol (VITAMIN D) 1000 UNITS tablet Take 1,000 Units by mouth 2 (two) times daily after a meal.    . fluticasone (FLONASE) 50 MCG/ACT nasal spray USE TWO SPRAY(S) IN EACH NOSTRIL ONCE DAILY 16 g 2  . latanoprost (XALATAN) 0.005 % ophthalmic solution INSTILL ONE DROP INTO EACH EYE AT BEDTIME 3 mL 2  . losartan-hydrochlorothiazide (HYZAAR) 50-12.5 MG per tablet TAKE ONE TABLET BY MOUTH ONCE DAILY. 90 tablet 0  .  losartan-hydrochlorothiazide (HYZAAR) 50-12.5 MG tablet TAKE ONE TABLET BY MOUTH ONCE DAILY 90 tablet 2  . lovastatin (MEVACOR) 40 MG tablet TAKE TWO TABLETS BY MOUTH AT BEDTIME 180 tablet 2  . PRADAXA 150 MG CAPS capsule TAKE ONE CAPSULE BY MOUTH TWICE DAILY 180 capsule 2   No current facility-administered medications for this visit.    Allergies  Allergen Reactions  . Ace Inhibitors     REACTION: cough  . Codeine     Review of Systems negative except from HPI and PMH  Physical Exam BP 132/63 mmHg  Pulse 82  Ht 4\' 4"  (1.321 m)  Wt 103 lb 12.8 oz (47.083 kg)  BMI 26.98 kg/m2 Well developed and well nourished in no acute distress HENT normal E scleral and icterus clear Neck Supple JVP flat; carotids brisk and full Clear to ausculation  Regular rate and rhythm, no murmurs gallops or rub Soft with active bowel sounds No clubbing cyanosis 1+ Edema Alert and oriented, grossly normal motor and sensory function Skin Warm and Dry  ECG demonstrates  Atrial fibrillation 82 -/13/41 Right bundle branch block left posterior fascicular block 16/12/44  Assessment and  Plan  Atrial fib paroxyxmal  Hypertension  Stable continue current therapies  No bleeding on anticoagulants

## 2015-09-21 NOTE — Patient Instructions (Signed)
Medication Instructions:  Your physician recommends that you continue on your current medications as directed. Please refer to the Current Medication list given to you today.   Labwork: NONE   Testing/Procedures: NONE  Follow-Up:  Your physician wants you to follow-up in: [place]. You will receive a reminder letter in the mail two months in advance. If you don't receive a letter, please call our office to schedule the follow-up appointment.         If you need a refill on your cardiac medications before your next appointment, please call your pharmacy.

## 2015-09-25 ENCOUNTER — Encounter: Payer: Self-pay | Admitting: Internal Medicine

## 2016-03-04 ENCOUNTER — Other Ambulatory Visit: Payer: Self-pay | Admitting: Endocrinology

## 2016-05-29 ENCOUNTER — Other Ambulatory Visit: Payer: Self-pay | Admitting: Endocrinology

## 2016-06-03 ENCOUNTER — Telehealth: Payer: Self-pay | Admitting: Endocrinology

## 2016-06-03 DIAGNOSIS — H409 Unspecified glaucoma: Secondary | ICD-10-CM

## 2016-06-03 NOTE — Telephone Encounter (Signed)
Bayley calling from [facility] eye care stated Patient need a [place] referral,  [phone]

## 2016-06-03 NOTE — Telephone Encounter (Signed)
done

## 2016-06-03 NOTE — Telephone Encounter (Signed)
See message can you place the referral for this patient.

## 2016-08-08 ENCOUNTER — Other Ambulatory Visit: Payer: Self-pay | Admitting: Endocrinology

## 2016-08-08 DIAGNOSIS — Z1231 Encounter for screening mammogram for malignant neoplasm of breast: Secondary | ICD-10-CM

## 2016-09-10 ENCOUNTER — Ambulatory Visit (INDEPENDENT_AMBULATORY_CARE_PROVIDER_SITE_OTHER): Payer: Commercial Managed Care - HMO | Admitting: Endocrinology

## 2016-09-10 ENCOUNTER — Encounter: Payer: Self-pay | Admitting: Endocrinology

## 2016-09-10 VITALS — BP 134/62 | HR 64 | Ht <= 58 in | Wt 111.0 lb

## 2016-09-10 DIAGNOSIS — Z23 Encounter for immunization: Secondary | ICD-10-CM

## 2016-09-10 DIAGNOSIS — M810 Age-related osteoporosis without current pathological fracture: Secondary | ICD-10-CM | POA: Diagnosis not present

## 2016-09-10 DIAGNOSIS — R739 Hyperglycemia, unspecified: Secondary | ICD-10-CM

## 2016-09-10 DIAGNOSIS — E079 Disorder of thyroid, unspecified: Secondary | ICD-10-CM

## 2016-09-10 DIAGNOSIS — E7800 Pure hypercholesterolemia, unspecified: Secondary | ICD-10-CM | POA: Diagnosis not present

## 2016-09-10 DIAGNOSIS — I480 Paroxysmal atrial fibrillation: Secondary | ICD-10-CM | POA: Diagnosis not present

## 2016-09-10 LAB — URINALYSIS, ROUTINE W REFLEX MICROSCOPIC
Bilirubin Urine: NEGATIVE
HGB URINE DIPSTICK: NEGATIVE
Ketones, ur: NEGATIVE
NITRITE: NEGATIVE
RBC / HPF: NONE SEEN (ref 0–?)
SPECIFIC GRAVITY, URINE: 1.01 (ref 1.000–1.030)
TOTAL PROTEIN, URINE-UPE24: NEGATIVE
URINE GLUCOSE: NEGATIVE
UROBILINOGEN UA: 0.2 (ref 0.0–1.0)
pH: 6.5 (ref 5.0–8.0)

## 2016-09-10 LAB — CBC WITH DIFFERENTIAL/PLATELET
Basophils Absolute: 0 10*3/uL (ref 0.0–0.1)
Basophils Relative: 0.7 % (ref 0.0–3.0)
EOS ABS: 0 10*3/uL (ref 0.0–0.7)
EOS PCT: 0.2 % (ref 0.0–5.0)
HCT: 35.1 % — ABNORMAL LOW (ref 36.0–46.0)
Hemoglobin: 12.1 g/dL (ref 12.0–15.0)
LYMPHS ABS: 1.1 10*3/uL (ref 0.7–4.0)
Lymphocytes Relative: 17.5 % (ref 12.0–46.0)
MCHC: 34.4 g/dL (ref 30.0–36.0)
MCV: 88.3 fl (ref 78.0–100.0)
MONO ABS: 1.1 10*3/uL — AB (ref 0.1–1.0)
Monocytes Relative: 17.7 % — ABNORMAL HIGH (ref 3.0–12.0)
NEUTROS PCT: 63.9 % (ref 43.0–77.0)
Neutro Abs: 4 10*3/uL (ref 1.4–7.7)
Platelets: 143 10*3/uL — ABNORMAL LOW (ref 150.0–400.0)
RBC: 3.97 Mil/uL (ref 3.87–5.11)
RDW: 14 % (ref 11.5–15.5)
WBC: 6.3 10*3/uL (ref 4.0–10.5)

## 2016-09-10 LAB — LIPID PANEL
Cholesterol: 122 mg/dL (ref 0–200)
HDL: 64 mg/dL (ref >39.00)
LDL CALC: 48 mg/dL (ref 0–99)
NONHDL: 57.61
Total CHOL/HDL Ratio: 2
Triglycerides: 49 mg/dL (ref 0.0–149.0)
VLDL: 9.8 mg/dL (ref 0.0–40.0)

## 2016-09-10 LAB — BASIC METABOLIC PANEL
BUN: 17 mg/dL (ref 6–23)
CALCIUM: 9.3 mg/dL (ref 8.4–10.5)
CHLORIDE: 102 meq/L (ref 96–112)
CO2: 33 meq/L — AB (ref 19–32)
CREATININE: 0.92 mg/dL (ref 0.40–1.20)
GFR: 62.64 mL/min (ref >60.00)
Glucose, Bld: 99 mg/dL (ref 70–99)
Potassium: 4.5 mEq/L (ref 3.5–5.1)
SODIUM: 142 meq/L (ref 135–145)

## 2016-09-10 LAB — HEPATIC FUNCTION PANEL
ALBUMIN: 3.9 g/dL (ref 3.5–5.2)
ALK PHOS: 82 U/L (ref 39–117)
ALT: 19 U/L (ref 0–35)
AST: 26 U/L (ref 0–37)
Bilirubin, Direct: 0.1 mg/dL (ref 0.0–0.3)
TOTAL PROTEIN: 6.5 g/dL (ref 6.0–8.3)
Total Bilirubin: 0.6 mg/dL (ref 0.2–1.2)

## 2016-09-10 LAB — TSH: TSH: 2.75 u[IU]/mL (ref 0.35–4.50)

## 2016-09-10 LAB — VITAMIN D 25 HYDROXY (VIT D DEFICIENCY, FRACTURES): VITD: 73.65 ng/mL (ref 30.00–100.00)

## 2016-09-10 LAB — HEMOGLOBIN A1C: HEMOGLOBIN A1C: 5.8 % (ref 4.6–6.5)

## 2016-09-10 NOTE — Patient Instructions (Addendum)
Please consider these measures for your health:  minimize alcohol.  Do not use tobacco products.  Have a colonoscopy at least every 10 years from age 78.  Women should have an annual mammogram from age 62.  Keep firearms safely stored.  Always use seat belts.  have working smoke alarms in your home.  See an eye doctor and dentist regularly.  Never drive under the influence of alcohol or drugs (including prescription drugs).  Those with fair skin should take precautions against the sun, and should carefully examine their skin once per month, for any new or changed moles. please let me know what your wishes would be, if artificial life support measures should become necessary.  It is critically important to prevent falling down (keep floor areas well-lit, dry, and free of loose objects.  If you have a cane, walker, or wheelchair, you should use it, even for short trips around the house.  Wear flat-soled shoes.  Also, try not to rush) good diet and exercise significantly improve your health.  please let me know if you wish to be referred to a dietician.  high blood sugar is very risky to your health.  you should see an eye doctor and dentist every year.  It is very important to get all recommended vaccinations.  blood tests are requested for you today.  We'll let you know about the results.

## 2016-09-10 NOTE — Progress Notes (Signed)
we discussed code status.  pt requests full code, but would not want to be started or maintained on artificial life-support measures if there was not a reasonable chance of recovery 

## 2016-09-10 NOTE — Progress Notes (Signed)
Subjective:    Patient ID: Nicole Hutchinson, female    DOB: [birth year], 80 y.o.   MRN: [id number]  HPI Pt is here for regular wellness examination, and is feeling pretty well in general, and says chronic med probs are stable, except as noted below Past Medical History:  Diagnosis Date  . Atrial fibrillation ([place]) 2008   On Pradaxa  . Dyslipidemia   . HTN (hypertension)   . Hyperglycemia   . Hyperthyroidism   . Moderate mitral regurgitation by prior echocardiogram    Normal left ventricular function left atrial enlargement echo 2010  . Osteoarthritis   . Osteoporosis   . Stroke ([place])   . Tubulovillous adenoma of colon 2002    Past Surgical History:  Procedure Laterality Date  . ABDOMINAL HYSTERECTOMY    . COLON RESECTION  2006  . DEXA  12/29/2005  . right hemi-thyroidectomy    . stress cardiolite  09/20/2003    Social History   Social History  . Marital status: Single    Spouse name: N/A  . Number of children: N/A  . Years of education: N/A   Occupational History  . Retired    Social History Main Topics  . Smoking status: Never Smoker  . Smokeless tobacco: Never Used  . Alcohol use No  . Drug use: No  . Sexual activity: Not on file   Other Topics Concern  . Not on file   Social History Narrative  . No narrative on file    Current Outpatient Prescriptions on File Prior to Visit  Medication Sig Dispense Refill  . cholecalciferol (VITAMIN D) 1000 UNITS tablet Take 1,000 Units by mouth 2 (two) times daily after a meal.    . fluticasone (FLONASE) 50 MCG/ACT nasal spray USE 2 SPRAYS IN EACH NOSTRIL ONCE DAILY 16 g 2  . latanoprost (XALATAN) 0.005 % ophthalmic solution INSTILL ONE DROP INTO EACH EYE AT BEDTIME 3 mL 2  . losartan-hydrochlorothiazide (HYZAAR) 50-12.5 MG tablet TAKE ONE TABLET BY MOUTH ONCE DAILY 90 tablet 2  . lovastatin (MEVACOR) 40 MG tablet TAKE TWO TABLETS BY MOUTH AT BEDTIME. 180 tablet 2  . PRADAXA 150 MG CAPS capsule TAKE ONE CAPSULE BY  MOUTH TWICE DAILY 180 capsule 2   No current facility-administered medications on file prior to visit.     Allergies  Allergen Reactions  . Ace Inhibitors     REACTION: cough  . Codeine     Family History  Problem Relation Age of Onset  . Lung cancer Paternal Uncle   . Pancreatic cancer Paternal Uncle   . Lung cancer Paternal Aunt     BP 134/62   Pulse 64   Ht 4\' 4"  (1.321 m)   Wt 111 lb (50.3 kg)   SpO2 95%   BMI 28.86 kg/m     Review of Systems  Constitutional: Negative for fever and unexpected weight change.  HENT: Negative for ear pain.   Eyes: Negative for photophobia.  Respiratory: Negative for shortness of breath.   Cardiovascular: Negative for chest pain.  Gastrointestinal: Negative for anal bleeding.  Endocrine: Negative for cold intolerance.  Genitourinary: Negative for hematuria.  Musculoskeletal: Negative for back pain.  Skin: Negative for rash.  Allergic/Immunologic: Negative for food allergies.  Neurological: Negative for syncope and numbness.  Psychiatric/Behavioral: Negative for sleep disturbance.   VS: see vs page GEN: no distress HEAD: head: no deformity eyes: no periorbital swelling, no proptosis external nose and ears are normal mouth: no lesion seen  NECK: a healed scar is present.  i do not appreciate a nodule in the thyroid or elsewhere in the neck.  CHEST WALL: no deformity.   LUNGS:  Clear to auscultation.   CV: reg rate and rhythm, no murmur.   ABD: abdomen is soft, nontender.  no hepatosplenomegaly.  not distended.  no hernia.   MUSCULOSKELETAL: muscle bulk and strength are grossly normal.  no obvious joint swelling.  gait is normal and steady EXTEMITIES: no deformity.  no ulcer on the feet.  feet are of normal temp.  1+ bilat leg edema.  There is bilateral onychomycosis of the toenails.   PULSES: dorsalis pedis intact bilat.  no carotid bruit NEURO:  cn 2-12 grossly intact.   readily moves all 4's.  sensation is intact to touch on  the feet SKIN:  Normal texture and temperature.  No rash or suspicious lesion is visible.  Patchy hyperpigmentation on the legs. NODES:  None palpable at the neck PSYCH: alert, well-oriented.  Does not appear anxious nor depressed.      Objective:   Physical Exam VS: see vs page GEN: no distress HEAD: head: no deformity eyes: no periorbital swelling, no proptosis external nose and ears are normal mouth: no lesion seen Ears: bilat hearing aids NECK: a healed scar is present.  i do not appreciate a nodule in the thyroid or elsewhere in the neck CHEST WALL: no deformity.   LUNGS:  Clear to auscultation.   CV: reg rate and rhythm, no murmur.   ABD: abdomen is soft, nontender.  no hepatosplenomegaly.  not distended.  no hernia.   MUSCULOSKELETAL: muscle bulk and strength are grossly normal.  no obvious joint swelling.  gait is normal and steady EXTEMITIES: no deformity.  no ulcer on the feet.  feet are of normal temp.  1+ bilat leg edema.  There is bilateral onychomycosis of the toenails.   PULSES: dorsalis pedis intact bilat.  no carotid bruit NEURO:  cn 2-12 grossly intact.   readily moves all 4's.  sensation is intact to touch on the feet SKIN:  Normal texture and temperature.  No rash or suspicious lesion is visible.  Patchy hyperpigmentation on the legs. NODES:  None palpable at the neck PSYCH: alert, well-oriented.  Does not appear anxious nor depressed.       Assessment & Plan:  Wellness visit today, with problems stable, except as noted.  Subjective:   Patient here for Medicare annual wellness visit and management of other chronic and acute problems.     Risk factors: advanced age    42 of Physicians Providing Medical Care to Patient:  See "snapshot"   Activities of Daily Living: In your present state of health, do you have any difficulty performing the following activities (lives with sister)?:  Preparing food and eating?: No  Bathing yourself: No  Getting  dressed: No  Using the toilet:No  Moving around from place to place: No  In the past year have you fallen or had a near fall?: No    Home Safety: Has smoke detector and wears seat belts. No firearms. No excess sun exposure.    Diet and Exercise  Current exercise habits: pt states limited by advanced age Dietary issues discussed: pt reports a healthy diet   Depression Screen  Q1: Over the past two weeks, have you felt down, depressed or hopeless? no  Q2: Over the past two weeks, have you felt little interest or pleasure in doing things? no   The  following portions of the patient's history were reviewed and updated as appropriate: allergies, current medications, past family history, past medical history, past social history, past surgical history and problem list.   Review of Systems  Denies hearing loss, and visual loss Objective:   Vision:  Advertising account executive, so she declines VA today Hearing: grossly normal Body mass index:  See vs page Msk: pt easily and quickly performs "get-up-and-go" from a sitting position.   Cognitive Impairment Assessment: cognition, memory and judgment appear normal.  remembers 3/3 at 5 minutes.  excellent recall.  can easily read and write a sentence.  alert and oriented x 3.     Assessment:   Medicare wellness utd on preventive parameters.     Plan:   During the course of the visit the patient was educated and counseled about appropriate screening and preventive services including:       Fall prevention   Screening mammography is up to date Bone densitometry screening is declined.  Diabetes screening  Nutrition counseling   Vaccines / LABS Zostavax / Pneumococcal Vaccine  today   Patient Instructions (the written plan) was given to the patient.  Patient is advised the following: Patient Instructions  Please consider these measures for your health:  minimize alcohol.  Do not use tobacco products.  Have a colonoscopy at least every 10 years from  age 59.  Women should have an annual mammogram from age 53.  Keep firearms safely stored.  Always use seat belts.  have working smoke alarms in your home.  See an eye doctor and dentist regularly.  Never drive under the influence of alcohol or drugs (including prescription drugs).  Those with fair skin should take precautions against the sun, and should carefully examine their skin once per month, for any new or changed moles. please let me know what your wishes would be, if artificial life support measures should become necessary.  It is critically important to prevent falling down (keep floor areas well-lit, dry, and free of loose objects.  If you have a cane, walker, or wheelchair, you should use it, even for short trips around the house.  Wear flat-soled shoes.  Also, try not to rush) good diet and exercise significantly improve your health.  please let me know if you wish to be referred to a dietician.  high blood sugar is very risky to your health.  you should see an eye doctor and dentist every year.  It is very important to get all recommended vaccinations.  blood tests are requested for you today.  We'll let you know about the results.

## 2016-09-11 LAB — PTH, INTACT AND CALCIUM
CALCIUM: 9.1 mg/dL (ref 8.6–10.4)
PTH: 51 pg/mL (ref 14–64)

## 2016-09-15 ENCOUNTER — Ambulatory Visit: Payer: Commercial Managed Care - HMO

## 2016-09-18 ENCOUNTER — Other Ambulatory Visit: Payer: Self-pay | Admitting: Endocrinology

## 2016-09-22 ENCOUNTER — Ambulatory Visit: Payer: Commercial Managed Care - HMO

## 2016-09-24 ENCOUNTER — Telehealth: Payer: Self-pay | Admitting: Endocrinology

## 2016-09-24 MED ORDER — FLUTICASONE PROPIONATE 50 MCG/ACT NA SUSP: NASAL | 2 refills | Status: DC

## 2016-09-24 MED ORDER — DABIGATRAN ETEXILATE MESYLATE 150 MG PO CAPS: 150.0000 mg | ORAL_CAPSULE | Freq: Two times a day (BID) | ORAL | 2 refills | Status: DC

## 2016-09-24 MED ORDER — LOVASTATIN 40 MG PO TABS: 80.0000 mg | ORAL_TABLET | Freq: Every day | ORAL | 2 refills | Status: DC

## 2016-09-24 NOTE — Telephone Encounter (Signed)
Refill submitted per patient's request.  

## 2016-09-24 NOTE — Telephone Encounter (Signed)
Refill of  lovastatin (MEVACOR) 40 MG tablet lovastatin (MEVACOR) 40 MG tablet PRADAXA 150 MG CAPS capsule fluticasone (FLONASE) 50 MCG/ACT nasal spray  Send to [facility] mail delivery Fax # [phone] Phone # [phone]

## 2016-10-13 ENCOUNTER — Telehealth: Payer: Self-pay | Admitting: Endocrinology

## 2016-10-13 MED ORDER — LOSARTAN POTASSIUM-HCTZ 50-12.5 MG PO TABS: 1.0000 | ORAL_TABLET | Freq: Every day | ORAL | 2 refills | Status: DC

## 2016-10-13 NOTE — Telephone Encounter (Signed)
Pt called to let us know we will be receiving a request for her Losartan through [place]. She will need that one refilled.

## 2016-10-13 NOTE — Telephone Encounter (Signed)
Refill submitted. 

## 2016-10-15 ENCOUNTER — Other Ambulatory Visit: Payer: Self-pay

## 2016-10-31 ENCOUNTER — Ambulatory Visit: Payer: Commercial Managed Care - HMO

## 2016-10-31 ENCOUNTER — Encounter (INDEPENDENT_AMBULATORY_CARE_PROVIDER_SITE_OTHER): Payer: Self-pay

## 2016-10-31 ENCOUNTER — Encounter: Payer: Self-pay | Admitting: Internal Medicine

## 2016-10-31 ENCOUNTER — Ambulatory Visit (INDEPENDENT_AMBULATORY_CARE_PROVIDER_SITE_OTHER): Payer: Medicare PPO | Admitting: Internal Medicine

## 2016-10-31 VITALS — BP 142/74 | HR 72 | Ht <= 58 in | Wt 109.0 lb

## 2016-10-31 DIAGNOSIS — I480 Paroxysmal atrial fibrillation: Secondary | ICD-10-CM

## 2016-10-31 NOTE — Progress Notes (Signed)
Patient Care Team: Renato Shin, MD as PCP - General Deboraha Sprang, MD (Cardiology) Clent Jacks, MD (Ophthalmology) Inda Castle, MD (Gastroenterology) Arvella Nigh, MD as Consulting Physician (Obstetrics and Gynecology)   HPI  Nicole Hutchinson is a 81 y.o. female  Seen in followup because of permanent atrial fibrillation and bradycardia. She has mitral regurgitation with moderate left atrial enlargement by echo i2010with a dimension of 48 mm.   Her thromboembolic risk factors include hypertension gender age and prior thromboembolic episode demonstrated by MRI scanning.   Anticoagulation is with dabigitran ; renal function was normal  12/17; K 4.2   She notes no symptoms of exercise intolerance lightheadedness shortness of breath or edema  She describes herself as a couch potato  Past Medical History:  Diagnosis Date  . Atrial fibrillation ([place]) 2008   On Pradaxa  . Dyslipidemia   . HTN (hypertension)   . Hyperglycemia   . Hyperthyroidism   . Moderate mitral regurgitation by prior echocardiogram    Normal left ventricular function left atrial enlargement echo 2010  . Osteoarthritis   . Osteoporosis   . Stroke ([place])   . Tubulovillous adenoma of colon 2002    Past Surgical History:  Procedure Laterality Date  . ABDOMINAL HYSTERECTOMY    . COLON RESECTION  2006  . DEXA  12/29/2005  . right hemi-thyroidectomy    . stress cardiolite  09/20/2003    Current Outpatient Prescriptions  Medication Sig Dispense Refill  . dabigatran (PRADAXA) 150 MG CAPS capsule Take 1 capsule (150 mg total) by mouth 2 (two) times daily. 180 capsule 2  . fluticasone (FLONASE) 50 MCG/ACT nasal spray USE TWO SPRAY(S) IN EACH NOSTRIL ONCE DAILY 25 g 2  . latanoprost (XALATAN) 0.005 % ophthalmic solution INSTILL ONE DROP INTO EACH EYE AT BEDTIME 3 mL 2  . losartan-hydrochlorothiazide (HYZAAR) 50-12.5 MG tablet Take 1 tablet by mouth daily. 90 tablet 2  . lovastatin (MEVACOR) 40 MG tablet Take  2 tablets (80 mg total) by mouth at bedtime. 180 tablet 2   No current facility-administered medications for this visit.     Allergies  Allergen Reactions  . Ace Inhibitors     REACTION: cough  . Codeine     Review of Systems negative except from HPI and PMH  Physical Exam BP (!) 142/74   Pulse 72   Ht 4\' 6"  (1.372 m)   Wt 109 lb (49.4 kg)   SpO2 97%   BMI 26.28 kg/m  Well developed and well nourished in no acute distress HENT normal E scleral and icterus clear Neck Supple JVP flat; carotids brisk and full Clear to ausculation  Regular rate and rhythm, no murmurs gallops or rub Soft with active bowel sounds No clubbing cyanosis   Edema Alert and oriented, grossly normal motor and sensory function Skin Warm and Dry  ECG demonstrates  Atrial fibrillation  -/13/46 Right bundle branch block left posterior fascicular block    Assessment and  Plan  Atrial fib permanent  Hypertension  Stable continue current therapies  No bleeding on anticoagulants  Encouraged her to followup with

## 2016-10-31 NOTE — Patient Instructions (Addendum)
Medication Instructions:  Your physician recommends that you continue on your current medications as directed. Please refer to the Current Medication list given to you today.  * If you need a refill on your cardiac medications before your next appointment, please call your pharmacy.   Labwork: None ordered  Testing/Procedures: None ordered  Follow-Up: Your physician wants you to follow-up in: 1 year with Dr. Klein.  You will receive a reminder letter in the mail two months in advance. If you don't receive a letter, please call our office to schedule the follow-up appointment.  Thank you for choosing [facility]!!         

## 2016-11-06 ENCOUNTER — Ambulatory Visit
Admission: RE | Admit: 2016-11-06 | Discharge: 2016-11-06 | Disposition: A | Discharge status: Home / Self Care | Payer: Medicare PPO | Source: Ambulatory Visit | Attending: Endocrinology | Admitting: Endocrinology

## 2016-11-06 ENCOUNTER — Ambulatory Visit (INDEPENDENT_AMBULATORY_CARE_PROVIDER_SITE_OTHER): Payer: Medicare PPO | Admitting: Endocrinology

## 2016-11-06 DIAGNOSIS — R05 Cough: Secondary | ICD-10-CM

## 2016-11-06 DIAGNOSIS — R059 Cough, unspecified: Secondary | ICD-10-CM

## 2016-11-06 DIAGNOSIS — J90 Pleural effusion, not elsewhere classified: Secondary | ICD-10-CM

## 2016-11-06 DIAGNOSIS — R053 Chronic cough: Secondary | ICD-10-CM | POA: Insufficient documentation

## 2016-11-06 MED ORDER — BENZONATATE 100 MG PO CAPS: 100.0000 mg | ORAL_CAPSULE | Freq: Two times a day (BID) | ORAL | 0 refills | Status: DC | PRN

## 2016-11-06 MED ORDER — AZITHROMYCIN 500 MG PO TABS: 500.0000 mg | ORAL_TABLET | Freq: Every day | ORAL | 0 refills | Status: DC

## 2016-11-06 MED ORDER — PROMETHAZINE-DM 6.25-15 MG/5ML PO SYRP: 5.0000 mL | ORAL_SOLUTION | Freq: Four times a day (QID) | ORAL | 0 refills | Status: DC | PRN

## 2016-11-06 NOTE — Progress Notes (Signed)
   Subjective:    Patient ID: Nicole Hutchinson, female    DOB: [birth year], 81 y.o.   MRN: [id number]  HPI Pt states 2 days of slight prod-quality cough in the chest, and assoc nasal congestion.  No earache.  Past Medical History:  Diagnosis Date  . Atrial fibrillation ([place]) 2008   On Pradaxa  . Dyslipidemia   . HTN (hypertension)   . Hyperglycemia   . Hyperthyroidism   . Moderate mitral regurgitation by prior echocardiogram    Normal left ventricular function left atrial enlargement echo 2010  . Osteoarthritis   . Osteoporosis   . Stroke ([place])   . Tubulovillous adenoma of colon 2002    Past Surgical History:  Procedure Laterality Date  . ABDOMINAL HYSTERECTOMY    . COLON RESECTION  2006  . DEXA  12/29/2005  . right hemi-thyroidectomy    . stress cardiolite  09/20/2003    Social History   Social History  . Marital status: Single    Spouse name: N/A  . Number of children: N/A  . Years of education: N/A   Occupational History  . Retired    Social History Main Topics  . Smoking status: Never Smoker  . Smokeless tobacco: Never Used  . Alcohol use No  . Drug use: No  . Sexual activity: Not on file   Other Topics Concern  . Not on file   Social History Narrative  . No narrative on file    Current Outpatient Prescriptions on File Prior to Visit  Medication Sig Dispense Refill  . dabigatran (PRADAXA) 150 MG CAPS capsule Take 1 capsule (150 mg total) by mouth 2 (two) times daily. 180 capsule 2  . fluticasone (FLONASE) 50 MCG/ACT nasal spray USE TWO SPRAY(S) IN EACH NOSTRIL ONCE DAILY 25 g 2  . latanoprost (XALATAN) 0.005 % ophthalmic solution INSTILL ONE DROP INTO EACH EYE AT BEDTIME 3 mL 2  . losartan-hydrochlorothiazide (HYZAAR) 50-12.5 MG tablet Take 1 tablet by mouth daily. 90 tablet 2  . lovastatin (MEVACOR) 40 MG tablet Take 2 tablets (80 mg total) by mouth at bedtime. 180 tablet 2   No current facility-administered medications on file prior to visit.      Allergies  Allergen Reactions  . Ace Inhibitors     REACTION: cough  . Codeine     Family History  Problem Relation Age of Onset  . Lung cancer Paternal Uncle   . Pancreatic cancer Paternal Uncle   . Lung cancer Paternal Aunt     BP 128/86   Pulse 77   Temp 97.9 F (36.6 C) (Oral)   Ht 4\' 6"  (1.372 m)   Wt 111 lb (50.3 kg)   SpO2 99%   BMI 26.76 kg/m    Review of Systems Denies fever and sob.      Objective:   Physical Exam VITAL SIGNS:  See vs page GENERAL: no distress head: no deformity  eyes: no periorbital swelling, no proptosis  external nose and ears are normal  mouth: no lesion seen Both eac's and tm's are normal.  LUNGS:  Clear to auscultation, except for rales at the right base.   CXR: bilat pl effs.    Assessment & Plan:  Acute bronchitis: I have sent prescriptions to your pharmacy Pl effs, new, uncertain etiology.  Ref pulm.

## 2016-11-06 NOTE — Patient Instructions (Addendum)
X-rays are requested for you today.  We'll let you know about the results. I have sent a prescription to your pharmacy, for an antibiotic and cough pills.   I hope you feel better soon.  If you don't feel better by next week, please call back.  Please call sooner if you get worse.

## 2016-11-10 ENCOUNTER — Telehealth: Payer: Self-pay | Admitting: Endocrinology

## 2016-11-10 NOTE — Telephone Encounter (Signed)
I contacted the patient's sister and advised of message. She voiced understanding and will advise the patient.

## 2016-11-10 NOTE — Telephone Encounter (Signed)
This is OK

## 2016-11-10 NOTE — Telephone Encounter (Signed)
Pt's sister called in and said that the soonest appointment at Pulmonary is 12/11/16 and she wants to know if this is okay or if she needs to be see sooner.

## 2016-11-10 NOTE — Telephone Encounter (Signed)
See message and please advise, Thanks!  

## 2016-12-01 ENCOUNTER — Ambulatory Visit: Payer: Commercial Managed Care - HMO

## 2016-12-11 ENCOUNTER — Encounter: Payer: Self-pay | Admitting: Internal Medicine

## 2016-12-11 ENCOUNTER — Other Ambulatory Visit (INDEPENDENT_AMBULATORY_CARE_PROVIDER_SITE_OTHER): Payer: Medicare PPO

## 2016-12-11 ENCOUNTER — Ambulatory Visit (INDEPENDENT_AMBULATORY_CARE_PROVIDER_SITE_OTHER): Payer: Medicare PPO | Admitting: Internal Medicine

## 2016-12-11 DIAGNOSIS — J90 Pleural effusion, not elsewhere classified: Secondary | ICD-10-CM

## 2016-12-11 DIAGNOSIS — R05 Cough: Secondary | ICD-10-CM

## 2016-12-11 DIAGNOSIS — R053 Chronic cough: Secondary | ICD-10-CM

## 2016-12-11 LAB — BASIC METABOLIC PANEL
BUN: 22 mg/dL (ref 6–23)
CALCIUM: 9.9 mg/dL (ref 8.4–10.5)
CO2: 34 mEq/L — ABNORMAL HIGH (ref 19–32)
CREATININE: 0.87 mg/dL (ref 0.40–1.20)
Chloride: 101 mEq/L (ref 96–112)
GFR: 66.77 mL/min (ref >60.00)
Glucose, Bld: 85 mg/dL (ref 70–99)
Potassium: 4 mEq/L (ref 3.5–5.1)
SODIUM: 141 meq/L (ref 135–145)

## 2016-12-11 NOTE — Patient Instructions (Signed)
Pleural effusion, right Ne issue of pleural effusio on right  Plan Check bmet and if kidneys ok do Ct chest with contrast If kidneys not ok, do ct chest wo contrast Do echo  Chronic cough Address as in pleural effusion   Followup - depending on CT chest results and echo results will advise next step that could involve a procedure called thoracentesis

## 2016-12-11 NOTE — Assessment & Plan Note (Signed)
Address as in pleural effusion

## 2016-12-11 NOTE — Progress Notes (Addendum)
Subjective:    Patient ID: Nicole Hutchinson, female    DOB: Aug 07, 1936, 81 y.o.   MRN: [id number]  PCP Nicole Shin, MD  HPI  IOV 12/11/2016 - note by Dr Chase Caller (not by RN)  Chief Complaint  Patient presents with  . Pulmonary Consult    Pt referred by Dr. Loanne Drilling for pleural effusion. Pt denies SOB. Pt c/o prod cough with clear mucus. Pt denies CP/tightness.    81 year old female. Accompanied by  ? Sister. Says some 5 weeks ago developed insidious onset of cough that continues to this day as mild cough with occ quality of white sputum. No associated dyspnea, edema, orhtopena, Her sister was sick too. Had cxr 11/06/16 - personally visuliased and showd new rt effusion (new  Since 2002) and some architectural distortion concerning for mass. No chest pains. No fever No weight loss. COughs till persists   Results for Nicole, [name] (MRN [id number]) as of 12/11/2016 10:25  Ref. Range 08/11/2014 10:03 08/11/2014 10:03 08/16/2015 10:49 09/10/2016 10:01 09/10/2016 10:01  Creatinine Latest Ref Range: 0.40 - 1.20 mg/dL 0.8  0.85 0.92    Results for Nicole, Hutchinson (MRN [id number]) as of 12/11/2016 10:25  Ref. Range 08/11/2014 10:03 08/11/2014 10:03 08/16/2015 10:49 09/10/2016 10:01 09/10/2016 10:01  Hemoglobin Latest Ref Range: 12.0 - 15.0 g/dL 11.9 (L)  13.1 12.1    CrCl cannot be calculated (Patient's most recent lab result is older than the maximum 21 days allowed.).    has a past medical history of Atrial fibrillation ([place]) (2008); Dyslipidemia; HTN (hypertension); Hyperglycemia; Hyperthyroidism; Moderate mitral regurgitation by prior echocardiogram; Osteoarthritis; Osteoporosis; Stroke ([place]); and Tubulovillous adenoma of colon (2002).   reports that she has never smoked. She has never used smokeless tobacco.  Past Surgical History:  Procedure Laterality Date  . ABDOMINAL HYSTERECTOMY    . COLON RESECTION  2006  . DEXA  12/29/2005  . right hemi-thyroidectomy    . stress cardiolite   09/20/2003    Allergies  Allergen Reactions  . Ace Inhibitors     REACTION: cough  . Codeine     Immunization History  Administered Date(s) Administered  . Influenza Split 07/01/2011  . Influenza Whole 07/04/2008, 08/06/2009  . Influenza, High Dose Seasonal PF 09/10/2016  . Influenza,inj,Quad PF,36+ Mos 07/13/2013, 08/11/2014, 08/16/2015  . Pneumococcal Conjugate-13 08/11/2014  . Pneumococcal Polysaccharide-23 10/08/1999  . Td 07/06/2001  . Tdap 08/11/2014  . Varicella 08/11/2014    Family History  Problem Relation Age of Onset  . Lung cancer Paternal Uncle   . Pancreatic cancer Paternal Uncle   . Lung cancer Paternal Aunt      Current Outpatient Prescriptions:  .  dabigatran (PRADAXA) 150 MG CAPS capsule, Take 1 capsule (150 mg total) by mouth 2 (two) times daily., Disp: 180 capsule, Rfl: 2 .  fluticasone (FLONASE) 50 MCG/ACT nasal spray, USE TWO SPRAY(S) IN EACH NOSTRIL ONCE DAILY, Disp: 25 g, Rfl: 2 .  latanoprost (XALATAN) 0.005 % ophthalmic solution, INSTILL ONE DROP INTO EACH EYE AT BEDTIME, Disp: 3 mL, Rfl: 2 .  losartan-hydrochlorothiazide (HYZAAR) 50-12.5 MG tablet, Take 1 tablet by mouth daily., Disp: 90 tablet, Rfl: 2 .  lovastatin (MEVACOR) 40 MG tablet, Take 2 tablets (80 mg total) by mouth at bedtime., Disp: 180 tablet, Rfl: 2    Review of Systems  Constitutional: Negative for fever and unexpected weight change.  HENT: Negative for congestion, dental problem, ear pain, nosebleeds, postnasal drip, rhinorrhea, sinus pressure, sneezing, sore throat and  trouble swallowing.   Eyes: Negative for redness and itching.  Respiratory: Positive for cough. Negative for chest tightness, shortness of breath and wheezing.   Cardiovascular: Negative for palpitations and leg swelling.  Gastrointestinal: Negative for nausea and vomiting.  Genitourinary: Negative for dysuria.  Musculoskeletal: Negative for joint swelling.  Skin: Negative for rash.  Neurological: Negative  for headaches.  Hematological: Does not bruise/bleed easily.  Psychiatric/Behavioral: Negative for dysphoric mood. The patient is not nervous/anxious.        Objective:   Physical Exam  Constitutional: She is oriented to person, place, and time. She appears well-developed and well-nourished. No distress.  Short elderly female Looks non-toxic  HENT:  Head: Normocephalic and atraumatic.  Right Ear: External ear normal.  Left Ear: External ear normal.  Mouth/Throat: Oropharynx is clear and moist. No oropharyngeal exudate.  Eyes: Conjunctivae and EOM are normal. Pupils are equal, round, and reactive to light. Right eye exhibits no discharge. Left eye exhibits no discharge. No scleral icterus.  Neck: Normal range of motion. Neck supple. No JVD present. No tracheal deviation present. No thyromegaly present.  Cardiovascular: Normal rate, regular rhythm, normal heart sounds and intact distal pulses.  Exam reveals no gallop and no friction rub.   No murmur heard. Pulmonary/Chest: Effort normal and breath sounds normal. No respiratory distress. She has no wheezes. She has no rales. She exhibits no tenderness.  Reduced air entry right base  Abdominal: Soft. Bowel sounds are normal. She exhibits no distension and no mass. There is no tenderness. There is no rebound and no guarding.  Musculoskeletal: Normal range of motion. She exhibits no edema or tenderness.  OA +  Lymphadenopathy:    She has no cervical adenopathy.  Neurological: She is alert and oriented to person, place, and time. She has normal reflexes. No cranial nerve deficit. She exhibits normal muscle tone. Coordination normal.  Skin: Skin is warm and dry. No rash noted. She is not diaphoretic. No erythema. No pallor.  Psychiatric: She has a normal mood and affect. Her behavior is normal. Judgment and thought content normal.  Vitals reviewed.  Vitals:   12/11/16 1002  BP: (!) 148/74  Pulse: 74  SpO2: 96%  Weight: 113 lb 3.2 oz  (51.3 kg)  Height: 4\' 6"  (1.372 m)   Body mass index is 27.29 kg/m.        Assessment & Plan:  Pleural effusion, right Ne issue of pleural effusio on right  Plan Check bmet and if kidneys ok do Ct chest with contrast If kidneys not ok, do ct chest wo contrast Do echo   Followup - depending on CT chest results and echo results will advise next step that could involve a procedure called thoracentesis   Chronic cough Address as in pleural effusion    Dr. Brand Males, M.D., [facility].C.P Pulmonary and Critical Care Medicine Staff Physician [place] [facility] Pager: [phone], If no answer or between  15:00h - 7:00h: call [phone]  12/11/2016 10:41 AM

## 2016-12-11 NOTE — Assessment & Plan Note (Addendum)
Ne issue of pleural effusio on right  Plan Check bmet and if kidneys ok do Ct chest with contrast If kidneys not ok, do ct chest wo contrast Do echo   Followup - depending on CT chest results and echo results will advise next step that could involve a procedure called thoracentesis

## 2016-12-12 ENCOUNTER — Telehealth: Payer: Self-pay | Admitting: Internal Medicine

## 2016-12-12 NOTE — Telephone Encounter (Signed)
To understand fludi better and other abnormaiut is why I ordered ct chest with contrast  Dr. Brand Males, M.D., [facility].C.P Pulmonary and Critical Care Medicine Staff Physician [place] [facility] Pager: [phone], If no answer or between  15:00h - 7:00h: call [phone]  12/12/2016 12:31 PM

## 2016-12-12 NOTE — Telephone Encounter (Signed)
Spoke with pt's sister and explained MRs message to her. She understood and had no further questions. Nothing further is needed at this time.

## 2016-12-12 NOTE — Telephone Encounter (Signed)
Spoke with pt's sister Curly Shores (dpr on file)- states that when pt had cxr on 2/1 ordered by Dr. Loanne Drilling it was noted that there was some fluid around lungs.  Pt and sister are requesting to know if the fluid is still there around lungs.  MR please advise.  Thanks.

## 2016-12-17 ENCOUNTER — Telehealth: Payer: Self-pay | Admitting: Endocrinology

## 2016-12-17 ENCOUNTER — Other Ambulatory Visit: Payer: Self-pay | Admitting: Endocrinology

## 2016-12-17 MED ORDER — OSELTAMIVIR PHOSPHATE 75 MG PO CAPS: 75.0000 mg | ORAL_CAPSULE | Freq: Two times a day (BID) | ORAL | 0 refills | Status: DC

## 2016-12-17 MED ORDER — AZITHROMYCIN 250 MG PO TABS: 250.0000 mg | ORAL_TABLET | Freq: Every day | ORAL | 0 refills | Status: DC

## 2016-12-17 NOTE — Telephone Encounter (Signed)
I have sent a prescription to your pharmacy  

## 2016-12-17 NOTE — Addendum Note (Signed)
Addended by: Renato Shin on: 12/17/2016 03:20 PM   Modules accepted: Orders

## 2016-12-17 NOTE — Telephone Encounter (Signed)
Pt has also had exposure to pertussis please call in a zpack thank you

## 2016-12-17 NOTE — Progress Notes (Signed)
ATC pt on both numbers. Unable to leave VM. WCB.  

## 2016-12-17 NOTE — Addendum Note (Signed)
Addended by: Verlin Grills T on: 12/17/2016 03:23 PM   Modules accepted: Orders

## 2016-12-17 NOTE — Telephone Encounter (Signed)
Pt was mistaken in thinking the child she was around was dx with pertussis, the child has the flu  zpack will not help the pt with this  She is asking if she should be on tamiflu. Please advise

## 2016-12-17 NOTE — Telephone Encounter (Signed)
Patient's sister advised of message and voiced understanding.

## 2016-12-18 MED ORDER — OSELTAMIVIR PHOSPHATE 75 MG PO CAPS: 75.0000 mg | ORAL_CAPSULE | Freq: Two times a day (BID) | ORAL | 0 refills | Status: DC

## 2016-12-18 NOTE — Telephone Encounter (Signed)
Patient's sister notified and will advise the patient.

## 2016-12-19 ENCOUNTER — Other Ambulatory Visit: Payer: Self-pay | Admitting: Internal Medicine

## 2016-12-19 DIAGNOSIS — J90 Pleural effusion, not elsewhere classified: Secondary | ICD-10-CM

## 2016-12-19 NOTE — Progress Notes (Signed)
Called and spoke to pt's sister, Curly Shores. Informed her of the results and recs per MR. Order placed for CT chest. Juanita verbalized understanding and denied any further questions or concerns at this time.

## 2016-12-29 ENCOUNTER — Telehealth: Payer: Self-pay | Admitting: Internal Medicine

## 2016-12-29 ENCOUNTER — Ambulatory Visit (INDEPENDENT_AMBULATORY_CARE_PROVIDER_SITE_OTHER)
Admission: RE | Admit: 2016-12-29 | Discharge: 2016-12-29 | Disposition: A | Discharge status: Home / Self Care | Payer: Medicare PPO | Source: Ambulatory Visit | Attending: Internal Medicine | Admitting: Internal Medicine

## 2016-12-29 ENCOUNTER — Other Ambulatory Visit: Payer: Self-pay

## 2016-12-29 ENCOUNTER — Ambulatory Visit (HOSPITAL_COMMUNITY): Payer: Medicare PPO | Attending: Cardiology

## 2016-12-29 DIAGNOSIS — J90 Pleural effusion, not elsewhere classified: Secondary | ICD-10-CM

## 2016-12-29 DIAGNOSIS — R911 Solitary pulmonary nodule: Secondary | ICD-10-CM

## 2016-12-29 DIAGNOSIS — I313 Pericardial effusion (noninflammatory): Secondary | ICD-10-CM | POA: Insufficient documentation

## 2016-12-29 DIAGNOSIS — I420 Dilated cardiomyopathy: Secondary | ICD-10-CM | POA: Insufficient documentation

## 2016-12-29 DIAGNOSIS — I341 Nonrheumatic mitral (valve) prolapse: Secondary | ICD-10-CM | POA: Diagnosis not present

## 2016-12-29 DIAGNOSIS — I340 Nonrheumatic mitral (valve) insufficiency: Secondary | ICD-10-CM

## 2016-12-29 MED ORDER — IOPAMIDOL (ISOVUE-300) INJECTION 61%
100.0000 mL | Freq: Once | INTRAVENOUS | Status: AC | PRN
  Administered 2016-12-29: 80 mL via INTRAVENOUS

## 2016-12-29 NOTE — Telephone Encounter (Signed)
Received call report from [place] w/ [facility] Pt had CT Chest this morning:  IMPRESSION: 1. Spiculated left lower lobe nodule is worrisome for primary bronchogenic carcinoma. Consider follow-up CT chest without contrast in 4 weeks to confirm persistence as an infectious or inflammatory lesion cannot be definitively excluded. If persistent, subsequent PET should be considered. These results will be called to the ordering clinician or representative by the Radiologist Assistant, and communication documented in the PACS or zVision Dashboard. 2. Partially loculated small to moderate right pleural effusion. Associated pleural thickening indicates chronicity. Trace left pleural fluid. 3. Small to moderate pericardial effusion. 4. Aortic atherosclerosis (ICD10-170.0). Coronary artery calcification. 5. Coiled splenic artery aneurysm.  MR worked last night and will work Event organiser to [place] marked URGENT

## 2016-12-30 NOTE — Telephone Encounter (Signed)
Pt's sister Curly Shores aware of results to Echo and CT scan.  Order placed for a referral to Cards for eval of moderate mitral regurgitation with elevated PASP.  Aware also that once the PET scan is scheduled she will need to call to schedule an appt with Dr Chase Caller.   All orders placed. Nothing further needed.

## 2016-12-30 NOTE — Telephone Encounter (Signed)
Reviewed CT and echo  1. ECHO - has moderate mitral regurgitation with elevated PASP - > refer cards  2. CT scan - she is never smoker has LLL nodule 1.4cm worrissome for early stage lung cancer -> plan get PET scan in 4 weeks- 6 weeks -> return to see me or [place]. Let sister knowt hat right pleural efffusion is not the dominant issue right now   Dr. Brand Males, M.D., [facility].C.P Pulmonary and Critical Care Medicine Staff Physician [place] [facility] Pager: [phone], If no answer or between  15:00h - 7:00h: call [phone]  12/30/2016 12:28 AM

## 2017-01-05 ENCOUNTER — Encounter (HOSPITAL_COMMUNITY): Payer: Medicare PPO

## 2017-01-09 ENCOUNTER — Encounter: Payer: Self-pay | Admitting: Internal Medicine

## 2017-01-09 ENCOUNTER — Encounter (HOSPITAL_COMMUNITY)
Admission: RE | Admit: 2017-01-09 | Discharge: 2017-01-09 | Disposition: A | Discharge status: Home / Self Care | Payer: Medicare PPO | Source: Ambulatory Visit | Attending: Internal Medicine | Admitting: Internal Medicine

## 2017-01-09 DIAGNOSIS — R911 Solitary pulmonary nodule: Secondary | ICD-10-CM | POA: Insufficient documentation

## 2017-01-09 LAB — GLUCOSE, CAPILLARY: Glucose-Capillary: 110 mg/dL — ABNORMAL HIGH (ref 65–99)

## 2017-01-09 MED ORDER — FLUDEOXYGLUCOSE F - 18 (FDG) INJECTION
7.2700 | Freq: Once | INTRAVENOUS | Status: AC | PRN
  Administered 2017-01-09: 7.27 via INTRAVENOUS

## 2017-01-12 ENCOUNTER — Telehealth: Payer: Self-pay | Admitting: Internal Medicine

## 2017-01-12 DIAGNOSIS — R590 Localized enlarged lymph nodes: Secondary | ICD-10-CM

## 2017-01-12 DIAGNOSIS — R911 Solitary pulmonary nodule: Secondary | ICD-10-CM

## 2017-01-12 NOTE — Telephone Encounter (Signed)
Spoke with sister and informed her of your message. She requested pt to have a follow up appointment on the same day as her follow up appointment when she comes in to see MR which will be June 5,2018. The appointment has been made  Also the order was placed for the CT scan as well. Nothing further is needed

## 2017-01-12 NOTE — Telephone Encounter (Signed)
  Let Tama Headings sister know that LLL nodule on PET scan is still there. Also PET scan pickd a new lesion in left hilar region but on PET scan is unclear if this is cancer or inflmmation.  My bet based on fact there is fluid around lungs and heart this is inflammation.   My rec 1. Keep appt iwht cards for effusion - pericardial 2. Return to see me or an APP in 2 months with CT chest super D with contrast for fu with mediastinal adenopathy and lung nodule  Dr. Brand Males, M.D., [facility].C.P Pulmonary and Critical Care Medicine Staff Physician [place] [facility] Pager: [phone], If no answer or between  15:00h - 7:00h: call [phone]  01/12/2017 4:33 PM     IMPRESSION: 1. Part solid left lower lobe nodule is not highly hypermetabolic, maximum SUV 1.7. However, it is not significantly changed over the last 10 days. Low-grade malignancy versus inflammatory lesion. 2. There is a small focus of mildly hypermetabolic left hilar activity, without a definite CT correlate, maximum SUV 4.1 as compared to adjacent background mediastinal blood pool activity SUV of 2.5. Appearance mildly abnormal but nonspecific for malignancy versus reactive disease. 3. Loculated bilateral pleural effusions, larger on the right than the left. No obvious hypermetabolic activity along the pleural effusions. 4. Considerable atherosclerosis as noted above. 5. Considerable degenerative findings in the thoracic and lumbar spine. 6. Cardiomegaly. 7. Moderate pericardial effusion. 8. Mild chronic paranasal sinusitis.   Electronically Signed   By: Van Clines M.D.   On: 01/09/2017 09:44   Signed By:  Sherryl Barters, MD

## 2017-01-12 NOTE — Telephone Encounter (Signed)
MR please advise of the results of the PET scan.  pts sister is calling for results. Thanks

## 2017-01-13 ENCOUNTER — Telehealth: Payer: Self-pay | Admitting: Internal Medicine

## 2017-01-13 NOTE — Telephone Encounter (Signed)
Patient sister called back and states that she no longer has any questions and doesn't require a call back anymore - pr

## 2017-01-13 NOTE — Addendum Note (Signed)
Addended by: Collier Salina on: 01/13/2017 09:07 AM   Modules accepted: Orders

## 2017-01-21 ENCOUNTER — Other Ambulatory Visit: Payer: Medicare PPO

## 2017-01-27 ENCOUNTER — Ambulatory Visit (INDEPENDENT_AMBULATORY_CARE_PROVIDER_SITE_OTHER): Payer: Medicare PPO | Admitting: Internal Medicine

## 2017-01-27 ENCOUNTER — Encounter: Payer: Self-pay | Admitting: Internal Medicine

## 2017-01-27 VITALS — BP 120/72 | HR 80 | Ht <= 58 in | Wt 112.0 lb

## 2017-01-27 DIAGNOSIS — I313 Pericardial effusion (noninflammatory): Secondary | ICD-10-CM

## 2017-01-27 DIAGNOSIS — I4821 Permanent atrial fibrillation: Secondary | ICD-10-CM

## 2017-01-27 DIAGNOSIS — I3139 Other pericardial effusion (noninflammatory): Secondary | ICD-10-CM

## 2017-01-27 DIAGNOSIS — I482 Chronic atrial fibrillation: Secondary | ICD-10-CM

## 2017-01-27 NOTE — Patient Instructions (Signed)

## 2017-01-27 NOTE — Progress Notes (Addendum)
Patient Care Team: Renato Shin, MD as PCP - General Deboraha Sprang, MD (Cardiology) Clent Jacks, MD (Ophthalmology) Inda Castle, MD (Gastroenterology) Arvella Nigh, MD as Consulting Physician (Obstetrics and Gynecology)   HPI  Nicole Hutchinson is a 81 y.o. female  Seen in followup because of permanent atrial fibrillation and bradycardia. She has mitral regurgitation with moderate left atrial enlargement by echo  010with a dimension of 48 mm.   Her thromboembolic risk factors include hypertension gender age and prior thromboembolic episode demonstrated by MRI scanning.   She was seen by Dr. Chase Caller for pleural effusion. She underwent extensive imaging with CT scanning demonstrating pleural fluid as well as small pericardial fluid she has known mitral regurgitation with some degree of elevated pulmonary pressures. She underwent PET scanning which I do not how to interpret but nothing was described as clearly hypermetabolic.  She has noted no change in her functional capacity. She has no shortness of breath doing her activities like shopping, going to [place],  Anticoagulation is with dabigitran ; renal function was normal  12/17; K 4.2   She notes no symptoms of exercise intolerance lightheadedness shortness of breath or edema  She describes herself as a couch potato  Past Medical History:  Diagnosis Date  . Atrial fibrillation ([place]) 2008   On Pradaxa  . Dyslipidemia   . HTN (hypertension)   . Hyperglycemia   . Hyperthyroidism   . Moderate mitral regurgitation by prior echocardiogram    Normal left ventricular function left atrial enlargement echo 2010  . Osteoarthritis   . Osteoporosis   . Stroke ([place])   . Tubulovillous adenoma of colon 2002    Past Surgical History:  Procedure Laterality Date  . ABDOMINAL HYSTERECTOMY    . COLON RESECTION  2006  . DEXA  12/29/2005  . right hemi-thyroidectomy    . stress cardiolite  09/20/2003    Current Outpatient  Prescriptions  Medication Sig Dispense Refill  . dabigatran (PRADAXA) 150 MG CAPS capsule Take 1 capsule (150 mg total) by mouth 2 (two) times daily. 180 capsule 2  . fluticasone (FLONASE) 50 MCG/ACT nasal spray USE TWO SPRAY(S) IN EACH NOSTRIL ONCE DAILY 25 g 2  . latanoprost (XALATAN) 0.005 % ophthalmic solution INSTILL ONE DROP INTO EACH EYE AT BEDTIME 3 mL 2  . losartan-hydrochlorothiazide (HYZAAR) 50-12.5 MG tablet Take 1 tablet by mouth daily. 90 tablet 2  . lovastatin (MEVACOR) 40 MG tablet Take 2 tablets (80 mg total) by mouth at bedtime. 180 tablet 2   No current facility-administered medications for this visit.     Allergies  Allergen Reactions  . Ace Inhibitors     REACTION: cough  . Codeine     Review of Systems negative except from HPI and PMH  Physical Exam BP 120/72   Pulse 80   Ht 4\' 6"  (1.372 m)   Wt 112 lb (50.8 kg)   SpO2 97%   BMI 27.00 kg/m  Well developed and well nourished in no acute distress HENT normal E scleral and icterus clear Neck Supple JVP flat; carotids brisk and full Clear to ausculation  Regular rate and rhythm, 2/6 posterior  Soft with active bowel sounds No clubbing cyanosis chronic Edema Alert and oriented, grossly normal motor and sensory function Skin Warm and Dry  ECG demonstrates  Atrial fibrillation 60 -/13/46 Right bundle branch block left posterior fascicular block    Assessment and  Plan  Atrial fib permanent  Hypertension  Mitral regurgitation  Pulm HTN  Stable continue current therapies  No bleeding on anticoagulants  I will need to discuss with Dr. Chase Caller and Dr. Aundra Dubin the implications of her echo. It is not clear given the fact that she has no attributable symptoms that anything need be done. I don't think the effusion is likely related to the pulmonary hypertension which is modest.  We spent more than 50% of our >25 min visit in face to face counseling regarding the above

## 2017-02-26 ENCOUNTER — Other Ambulatory Visit (INDEPENDENT_AMBULATORY_CARE_PROVIDER_SITE_OTHER): Payer: Medicare PPO

## 2017-02-26 DIAGNOSIS — R911 Solitary pulmonary nodule: Secondary | ICD-10-CM

## 2017-02-26 DIAGNOSIS — R590 Localized enlarged lymph nodes: Secondary | ICD-10-CM

## 2017-02-26 LAB — BASIC METABOLIC PANEL
BUN: 17 mg/dL (ref 6–23)
CO2: 26 mEq/L (ref 19–32)
Calcium: 9.4 mg/dL (ref 8.4–10.5)
Chloride: 104 mEq/L (ref 96–112)
Creatinine, Ser: 0.92 mg/dL (ref 0.40–1.20)
GFR: 62.57 mL/min (ref >60.00)
Glucose, Bld: 104 mg/dL — ABNORMAL HIGH (ref 70–99)
POTASSIUM: 4.3 meq/L (ref 3.5–5.1)
SODIUM: 142 meq/L (ref 135–145)

## 2017-03-05 ENCOUNTER — Ambulatory Visit (INDEPENDENT_AMBULATORY_CARE_PROVIDER_SITE_OTHER)
Admission: RE | Admit: 2017-03-05 | Discharge: 2017-03-05 | Disposition: A | Discharge status: Home / Self Care | Payer: Medicare PPO | Source: Ambulatory Visit | Attending: Internal Medicine | Admitting: Internal Medicine

## 2017-03-05 DIAGNOSIS — R590 Localized enlarged lymph nodes: Secondary | ICD-10-CM | POA: Diagnosis not present

## 2017-03-05 DIAGNOSIS — R911 Solitary pulmonary nodule: Secondary | ICD-10-CM

## 2017-03-05 MED ORDER — IOPAMIDOL (ISOVUE-300) INJECTION 61%
80.0000 mL | Freq: Once | INTRAVENOUS | Status: AC | PRN
  Administered 2017-03-05: 80 mL via INTRAVENOUS

## 2017-03-10 ENCOUNTER — Encounter: Payer: Self-pay | Admitting: Internal Medicine

## 2017-03-10 ENCOUNTER — Other Ambulatory Visit (INDEPENDENT_AMBULATORY_CARE_PROVIDER_SITE_OTHER): Payer: Medicare PPO

## 2017-03-10 ENCOUNTER — Ambulatory Visit (INDEPENDENT_AMBULATORY_CARE_PROVIDER_SITE_OTHER): Payer: Medicare PPO | Admitting: Internal Medicine

## 2017-03-10 VITALS — BP 102/60 | HR 72 | Ht <= 58 in | Wt 110.8 lb

## 2017-03-10 DIAGNOSIS — R05 Cough: Secondary | ICD-10-CM

## 2017-03-10 DIAGNOSIS — R911 Solitary pulmonary nodule: Secondary | ICD-10-CM | POA: Diagnosis not present

## 2017-03-10 DIAGNOSIS — J90 Pleural effusion, not elsewhere classified: Secondary | ICD-10-CM

## 2017-03-10 DIAGNOSIS — R053 Chronic cough: Secondary | ICD-10-CM

## 2017-03-10 LAB — SEDIMENTATION RATE: Sed Rate: 20 mm/hr (ref 0–30)

## 2017-03-10 NOTE — Patient Instructions (Addendum)
Pleural effusion, right Do ANA, DS DNA, RF, CCP, ESR blood work to explain pleural and pericardial effusion Given asymptomatic state and risk benefit for procedures will follow explectantly  Followup Will call with blood results 6 months or sooner if needed - will do cxr at that time  Chronic cough Glad this is resolved  Nodule of left lung Resolved may 2018  Plan No furthyer followup

## 2017-03-10 NOTE — Progress Notes (Signed)
Subjective:     Patient ID: Nicole Hutchinson, female   DOB: 07-22-1936, 81 y.o.   MRN: [id number]  HPI     Review of Systems     Objective:   Physical Exam     Assessment:         Plan:      Dr. Brand Males, M.D., [facility].C.P Pulmonary and Critical Care Medicine Staff Physician [place] [facility] Pager: [phone], If no answer or between  15:00h - 7:00h: call [phone]  03/10/2017 10:11 AM

## 2017-03-10 NOTE — Progress Notes (Signed)
Subjective:     Patient ID: Nicole Hutchinson, female   DOB: 01-19-1936, 81 y.o.   MRN: [id number]  HPI   m, female    DOB: Mar 24, 1936, 81 y.o.   MRN: [id number]  PCP Renato Shin, MD  HPI  IOV 12/11/2016 - note by Dr Chase Caller (not by RN)  Chief Complaint  Patient presents with  . Pulmonary Consult    Pt referred by Dr. Loanne Drilling for pleural effusion. Pt denies SOB. Pt c/o prod cough with clear mucus. Pt denies CP/tightness.    81 year old female. Accompanied by  ? Sister. Says some 5 weeks ago developed insidious onset of cough that continues to this day as mild cough with occ quality of white sputum. No associated dyspnea, edema, orhtopena, Her sister was sick too. Had cxr 11/06/16 - personally visuliased and showd new rt effusion (new  Since 2002) and some architectural distortion concerning for mass. No chest pains. No fever No weight loss. COughs till persists   Results for REIGNA, RUPERTO (MRN [id number]) as of 12/11/2016 10:25  Ref. Range 08/11/2014 10:03 08/11/2014 10:03 08/16/2015 10:49 09/10/2016 10:01 09/10/2016 10:01  Creatinine Latest Ref Range: 0.40 - 1.20 mg/dL 0.8  0.85 0.92    Results for LOLITA, FAULDS (MRN [id number]) as of 12/11/2016 10:25  Ref. Range 08/11/2014 10:03 08/11/2014 10:03 08/16/2015 10:49 09/10/2016 10:01 09/10/2016 10:01  Hemoglobin Latest Ref Range: 12.0 - 15.0 g/dL 11.9 (L)  13.1 12.1     OV 03/10/2017  Chief Complaint  Patient presents with  . Follow-up    Pt here after CT chest. Pt denies any changes in breathing since last OV. Pt c/o mild dry cough - pt states this is from seasonal allergies. Pt denies CP/tightness and f/c/s.     Follow-up  - Cough workup early in 2018 that resulted in discovery of pleural and pericardial effusion and left lower lobe lung nodule  Currently she is without cough and she is feeling good. She is chronic venous stasis edema. She had follow-up CT chest in May 2018 that showed resolution of the left lung nodule but she still  has persistent right pleural loculated effusion and mild to moderate pericardial effusion. She saw Dr. Caryl Comes in April 2018 I reviewed his notes. He has recommended observation therapy for the pericardial effusion given her asymptomatic state and risk-benefit ratio procedures. Overall she's feeling well but she is very sedentary as always. [name] also my patient is with her       has a past medical history of Atrial fibrillation ([place]) (2008); Dyslipidemia; HTN (hypertension); Hyperglycemia; Hyperthyroidism; Moderate mitral regurgitation by prior echocardiogram; Osteoarthritis; Osteoporosis; Stroke ([place]); and Tubulovillous adenoma of colon (2002).   reports that she has never smoked. She has never used smokeless tobacco.  Past Surgical History:  Procedure Laterality Date  . ABDOMINAL HYSTERECTOMY    . COLON RESECTION  2006  . DEXA  12/29/2005  . right hemi-thyroidectomy    . stress cardiolite  09/20/2003    Allergies  Allergen Reactions  . Ace Inhibitors     REACTION: cough  . Codeine     Immunization History  Administered Date(s) Administered  . Influenza Split 07/01/2011  . Influenza Whole 07/04/2008, 08/06/2009  . Influenza, High Dose Seasonal PF 09/10/2016  . Influenza,inj,Quad PF,36+ Mos 07/13/2013, 08/11/2014, 08/16/2015  . Pneumococcal Conjugate-13 08/11/2014  . Pneumococcal Polysaccharide-23 10/08/1999  . Td 07/06/2001  . Tdap 08/11/2014  . Varicella 08/11/2014    Family History  Problem Relation Age of Onset  . Lung cancer Paternal Uncle   . Pancreatic cancer Paternal Uncle   . Lung cancer Paternal Aunt      Current Outpatient Prescriptions:  .  dabigatran (PRADAXA) 150 MG CAPS capsule, Take 1 capsule (150 mg total) by mouth 2 (two) times daily., Disp: 180 capsule, Rfl: 2 .  latanoprost (XALATAN) 0.005 % ophthalmic solution, INSTILL ONE DROP INTO EACH EYE AT BEDTIME, Disp: 3 mL, Rfl: 2 .  loratadine (CLARITIN) 10 MG tablet, Take 10 mg by mouth  daily., Disp: , Rfl:  .  losartan-hydrochlorothiazide (HYZAAR) 50-12.5 MG tablet, Take 1 tablet by mouth daily., Disp: 90 tablet, Rfl: 2 .  lovastatin (MEVACOR) 40 MG tablet, Take 2 tablets (80 mg total) by mouth at bedtime., Disp: 180 tablet, Rfl: 2 .  fluticasone (FLONASE) 50 MCG/ACT nasal spray, USE TWO SPRAY(S) IN EACH NOSTRIL ONCE DAILY (Patient not taking: Reported on 03/10/2017), Disp: 25 g, Rfl: 2   Review of Systems     Objective:   Physical Exam  Constitutional: She is oriented to person, place, and time. She appears well-developed and well-nourished. No distress.  HENT:  Head: Normocephalic and atraumatic.  Right Ear: External ear normal.  Left Ear: External ear normal.  Mouth/Throat: Oropharynx is clear and moist. No oropharyngeal exudate.  Eyes: Conjunctivae and EOM are normal. Pupils are equal, round, and reactive to light. Right eye exhibits no discharge. Left eye exhibits no discharge. No scleral icterus.  Neck: Normal range of motion. Neck supple. No JVD present. No tracheal deviation present. No thyromegaly present.  Cardiovascular: Normal rate, regular rhythm, normal heart sounds and intact distal pulses.  Exam reveals no gallop and no friction rub.   No murmur heard. Pulmonary/Chest: Effort normal and breath sounds normal. No respiratory distress. She has no wheezes. She has no rales. She exhibits no tenderness.  Abdominal: Soft. Bowel sounds are normal. She exhibits no distension and no mass. There is no tenderness. There is no rebound and no guarding.  Musculoskeletal: Normal range of motion. She exhibits edema. She exhibits no tenderness.  Chronic edema +  Lymphadenopathy:    She has no cervical adenopathy.  Neurological: She is alert and oriented to person, place, and time. She has normal reflexes. No cranial nerve deficit. She exhibits normal muscle tone. Coordination normal.  Skin: Skin is warm and dry. No rash noted. She is not diaphoretic. No erythema. No pallor.   Psychiatric: She has a normal mood and affect. Her behavior is normal. Judgment and thought content normal.  Vitals reviewed.  Vitals:   03/10/17 0922  BP: 102/60  Pulse: 72  SpO2: 95%  Weight: 110 lb 12.8 oz (50.3 kg)  Height: '4\' 6"'$  (1.372 m)    Estimated body mass index is 26.71 kg/m as calculated from the following:   Height as of this encounter: '4\' 6"'$  (1.372 m).   Weight as of this encounter: 110 lb 12.8 oz (50.3 kg).      Assessment:       ICD-9-CM ICD-10-CM   1. Nodule of left lung 793.11 R91.1   2. Pleural effusion, right 511.9 J90   3. Chronic cough 786.2 R05        Plan:     Pleural effusion, right Do ANA, DS DNA, RF, CCP, ESR blood work to explain pleural and pericardial effusion Given asymptomatic state and risk benefit for procedures will follow explectantly  Followup Will call with blood results 6 months or sooner if needed -  will do cxr at that time  Chronic cough Glad this is resolved  Nodule of left lung Resolved may 2018  Plan No furthyer followup  > 50% of this > 25 min visit spent in face to face counseling or coordination of care    Dr. Brand Males, M.D., [facility].C.P Pulmonary and Critical Care Medicine Staff Physician [place] [facility] Pager: [phone], If no answer or between  15:00h - 7:00h: call [phone]  03/10/2017 10:06 AM

## 2017-03-10 NOTE — Addendum Note (Signed)
Addended by: Collier Salina on: 03/10/2017 10:25 AM   Modules accepted: Orders

## 2017-03-10 NOTE — Assessment & Plan Note (Signed)
Resolved may 2018  Plan No furthyer followup

## 2017-03-10 NOTE — Assessment & Plan Note (Signed)
Glad this is resolved

## 2017-03-10 NOTE — Assessment & Plan Note (Addendum)
Do ANA, DS DNA, RF, CCP, ESR blood work to explain pleural and pericardial effusion Given asymptomatic state and risk benefit for procedures will follow explectantly  Followup Will call with blood results 6 months or sooner if needed - will do cxr at that time

## 2017-03-11 LAB — ANTI-NUCLEAR AB-TITER (ANA TITER): ANA Titer 1: 1:80 {titer} — ABNORMAL HIGH

## 2017-03-11 LAB — ANTI-DNA ANTIBODY, DOUBLE-STRANDED: ds DNA Ab: <1 IU/mL

## 2017-03-11 LAB — ANA: ANA: POSITIVE — AB

## 2017-03-11 LAB — RHEUMATOID FACTOR: Rhuematoid fact SerPl-aCnc: <14 IU/mL (ref <14)

## 2017-03-12 LAB — CYCLIC CITRUL PEPTIDE ANTIBODY, IGG

## 2017-03-12 NOTE — Progress Notes (Signed)
Pt seen on 03/10/2017 and results were discussed. Will sign off.

## 2017-04-20 ENCOUNTER — Ambulatory Visit
Admission: RE | Admit: 2017-04-20 | Discharge: 2017-04-20 | Disposition: A | Discharge status: Home / Self Care | Payer: Medicare PPO | Source: Ambulatory Visit | Attending: Endocrinology | Admitting: Endocrinology

## 2017-04-20 ENCOUNTER — Telehealth: Payer: Self-pay | Admitting: Endocrinology

## 2017-04-20 ENCOUNTER — Ambulatory Visit (INDEPENDENT_AMBULATORY_CARE_PROVIDER_SITE_OTHER): Payer: Medicare PPO | Admitting: Endocrinology

## 2017-04-20 ENCOUNTER — Encounter: Payer: Self-pay | Admitting: Endocrinology

## 2017-04-20 ENCOUNTER — Ambulatory Visit: Payer: Medicare PPO | Admitting: Endocrinology

## 2017-04-20 DIAGNOSIS — M545 Low back pain: Secondary | ICD-10-CM

## 2017-04-20 DIAGNOSIS — M549 Dorsalgia, unspecified: Secondary | ICD-10-CM | POA: Insufficient documentation

## 2017-04-20 LAB — URINALYSIS, ROUTINE W REFLEX MICROSCOPIC
Bilirubin Urine: NEGATIVE
Hgb urine dipstick: NEGATIVE
Ketones, ur: NEGATIVE
LEUKOCYTES UA: NEGATIVE
Nitrite: NEGATIVE
RBC / HPF: NONE SEEN (ref 0–?)
Specific Gravity, Urine: <=1.005 — AB (ref 1.000–1.030)
TOTAL PROTEIN, URINE-UPE24: NEGATIVE
URINE GLUCOSE: NEGATIVE
UROBILINOGEN UA: 0.2 (ref 0.0–1.0)
pH: 7 (ref 5.0–8.0)

## 2017-04-20 LAB — POCT URINALYSIS DIPSTICK
Bilirubin, UA: NEGATIVE
Blood, UA: NEGATIVE
GLUCOSE UA: NEGATIVE
KETONES UA: NEGATIVE
LEUKOCYTES UA: NEGATIVE
NITRITE UA: NEGATIVE
PH UA: 5 (ref 5.0–8.0)
Spec Grav, UA: 1.01 (ref 1.010–1.025)
Urobilinogen, UA: 0.2 E.U./dL

## 2017-04-20 NOTE — Patient Instructions (Addendum)
Let's check the urine test and x-rays I hope you feel better soon.  If you don't feel better by next week, please call back.  Please call sooner if you get worse.

## 2017-04-20 NOTE — Progress Notes (Signed)
Subjective:    Patient ID: Nicole Hutchinson, female    DOB: 11-Dec-1935, 81 y.o.   MRN: [id number]  HPI Pt states 2 weeks of moderate pain at the lower back, rad to post aspects of both thighs.  This started in the context of BM, and is very slow to improve.  She has assoc decreased urination, but she does not fel as though she has urinary retention.   Past Medical History:  Diagnosis Date  . Atrial fibrillation ([place]) 2008   On Pradaxa  . Dyslipidemia   . HTN (hypertension)   . Hyperglycemia   . Hyperthyroidism   . Moderate mitral regurgitation by prior echocardiogram    Normal left ventricular function left atrial enlargement echo 2010  . Osteoarthritis   . Osteoporosis   . Stroke ([place])   . Tubulovillous adenoma of colon 2002    Past Surgical History:  Procedure Laterality Date  . ABDOMINAL HYSTERECTOMY    . COLON RESECTION  2006  . DEXA  12/29/2005  . right hemi-thyroidectomy    . stress cardiolite  09/20/2003    Social History   Social History  . Marital status: Single    Spouse name: N/A  . Number of children: N/A  . Years of education: N/A   Occupational History  . Retired    Social History Main Topics  . Smoking status: Never Smoker  . Smokeless tobacco: Never Used  . Alcohol use No  . Drug use: No  . Sexual activity: Not on file   Other Topics Concern  . Not on file   Social History Narrative   Retired.    Lives with sister, Zadie Rhine.        Current Outpatient Prescriptions on File Prior to Visit  Medication Sig Dispense Refill  . dabigatran (PRADAXA) 150 MG CAPS capsule Take 1 capsule (150 mg total) by mouth 2 (two) times daily. 180 capsule 2  . fluticasone (FLONASE) 50 MCG/ACT nasal spray USE TWO SPRAY(S) IN EACH NOSTRIL ONCE DAILY 25 g 2  . latanoprost (XALATAN) 0.005 % ophthalmic solution INSTILL ONE DROP INTO EACH EYE AT BEDTIME 3 mL 2  . loratadine (CLARITIN) 10 MG tablet Take 10 mg by mouth daily.    Marland Kitchen losartan-hydrochlorothiazide  (HYZAAR) 50-12.5 MG tablet Take 1 tablet by mouth daily. 90 tablet 2  . lovastatin (MEVACOR) 40 MG tablet Take 2 tablets (80 mg total) by mouth at bedtime. 180 tablet 2   No current facility-administered medications on file prior to visit.     Allergies  Allergen Reactions  . Ace Inhibitors     REACTION: cough  . Codeine     Family History  Problem Relation Age of Onset  . Lung cancer Paternal Uncle   . Pancreatic cancer Paternal Uncle   . Lung cancer Paternal Aunt     BP 126/72   Pulse 63   Temp 98.6 F (37 C) (Oral)   Wt 109 lb (49.4 kg)   SpO2 98%   BMI 26.28 kg/m    Review of Systems She has slight hematuria, but no numbness of the LE's.     Objective:   Physical Exam VITAL SIGNS:  See vs page GENERAL: no distress.  In wheelchair Lower back: nontender Neuro: sensation is intact to touch, and motor is 5/5, throughout the LE's.      Assessment & Plan:  Low-back pain, new, uncertain etiology. HTN: well-controlled.  Please continue the same medication.  Patient Instructions  Let's check  the urine test and x-rays I hope you feel better soon.  If you don't feel better by next week, please call back.  Please call sooner if you get worse.

## 2017-04-20 NOTE — Telephone Encounter (Signed)
Nicole Hutchinson asked that you send any script from the urine culture [facility] [place].

## 2017-04-21 ENCOUNTER — Other Ambulatory Visit: Payer: Self-pay | Admitting: Endocrinology

## 2017-04-21 DIAGNOSIS — M545 Low back pain: Secondary | ICD-10-CM

## 2017-04-21 NOTE — Telephone Encounter (Signed)
Labs came back normal on the urinalysis, no script needed at this time. Patient notified of message this morning by Erasmo Leventhal CMA.

## 2017-04-29 ENCOUNTER — Telehealth: Payer: Self-pay | Admitting: Endocrinology

## 2017-04-30 ENCOUNTER — Other Ambulatory Visit: Payer: Self-pay

## 2017-04-30 MED ORDER — DABIGATRAN ETEXILATE MESYLATE 150 MG PO CAPS: 150.0000 mg | ORAL_CAPSULE | Freq: Two times a day (BID) | ORAL | 2 refills | Status: DC

## 2017-04-30 MED ORDER — LOVASTATIN 40 MG PO TABS: 80.0000 mg | ORAL_TABLET | Freq: Every day | ORAL | 2 refills | Status: DC

## 2017-04-30 MED ORDER — LOSARTAN POTASSIUM-HCTZ 50-12.5 MG PO TABS: 1.0000 | ORAL_TABLET | Freq: Every day | ORAL | 2 refills | Status: DC

## 2017-05-05 ENCOUNTER — Other Ambulatory Visit: Payer: Self-pay

## 2017-05-05 MED ORDER — FLUTICASONE PROPIONATE 50 MCG/ACT NA SUSP: NASAL | 2 refills | Status: DC

## 2017-05-05 MED ORDER — DABIGATRAN ETEXILATE MESYLATE 150 MG PO CAPS
150.0000 mg | ORAL_CAPSULE | Freq: Two times a day (BID) | ORAL | 2 refills | Status: DC
Start: 2017-05-05 — End: 2018-01-08

## 2017-05-05 MED ORDER — LOVASTATIN 40 MG PO TABS: 80.0000 mg | ORAL_TABLET | Freq: Every day | ORAL | 2 refills | Status: DC

## 2017-05-05 MED ORDER — LOSARTAN POTASSIUM-HCTZ 50-12.5 MG PO TABS: 1.0000 | ORAL_TABLET | Freq: Every day | ORAL | 2 refills | Status: DC

## 2017-05-05 NOTE — Telephone Encounter (Signed)
Prescriptions sent to [facility] 

## 2017-05-05 NOTE — Telephone Encounter (Signed)
**  Remind patient they can make refill requests via MyChart**  Medication refill request (Name & Dosage): fluticasone (FLONASE) 50 MCG/ACT nasal spray dabigatran (PRADAXA) 150 MG CAPS capsule losartan-hydrochlorothiazide (HYZAAR) 50-12.5 MG tablet lovastatin (MEVACOR) 40 MG tablet Preferred pharmacy (Name & Address):  [place], [place] [phone] (Phone) [phone] (Fax)   Other comments (if applicable):

## 2017-09-21 ENCOUNTER — Other Ambulatory Visit: Payer: Self-pay | Admitting: *Deleted

## 2017-09-21 DIAGNOSIS — J90 Pleural effusion, not elsewhere classified: Secondary | ICD-10-CM

## 2017-09-22 ENCOUNTER — Ambulatory Visit (INDEPENDENT_AMBULATORY_CARE_PROVIDER_SITE_OTHER)
Admission: RE | Admit: 2017-09-22 | Discharge: 2017-09-22 | Disposition: A | Discharge status: Home / Self Care | Payer: Medicare PPO | Source: Ambulatory Visit | Attending: Internal Medicine | Admitting: Internal Medicine

## 2017-09-22 ENCOUNTER — Encounter: Payer: Self-pay | Admitting: Internal Medicine

## 2017-09-22 ENCOUNTER — Ambulatory Visit: Payer: Medicare PPO | Admitting: Internal Medicine

## 2017-09-22 VITALS — BP 142/68 | HR 63 | Ht <= 58 in | Wt 114.6 lb

## 2017-09-22 DIAGNOSIS — R600 Localized edema: Secondary | ICD-10-CM | POA: Diagnosis not present

## 2017-09-22 DIAGNOSIS — J90 Pleural effusion, not elsewhere classified: Secondary | ICD-10-CM

## 2017-09-22 NOTE — Patient Instructions (Signed)
ICD-10-CM   1. Pleural effusion, right J90   2. Pedal edema R60.0     Stable but refractory problem  Plan - sleep with legs elevated using a wedge pillow - use ted stockings in day for legs - no spefcific intervention for fluid in lungs other than cardiology support  Followup As needed

## 2017-09-22 NOTE — Progress Notes (Signed)
Subjective:     Patient ID: Nicole Hutchinson, female   DOB: June 05, 1936, 81 y.o.   MRN: [id number]  HPI  PCP Renato Shin, MD  HPI  IOV 12/11/2016 - note by Dr Chase Caller (not by RN)  Chief Complaint  Patient presents with  . Pulmonary Consult    Pt referred by Dr. Loanne Drilling for pleural effusion. Pt denies SOB. Pt c/o prod cough with clear mucus. Pt denies CP/tightness.    81 year old female. Accompanied by  ? Sister. Says some 5 weeks ago developed insidious onset of cough that continues to this day as mild cough with occ quality of white sputum. No associated dyspnea, edema, orhtopena, Her sister was sick too. Had cxr 11/06/16 - personally visuliased and showd new rt effusion (new  Since 2002) and some architectural distortion concerning for mass. No chest pains. No fever No weight loss. COughs till persists    OV 03/10/2017  Chief Complaint  Patient presents with  . Follow-up    Pt here after CT chest. Pt denies any changes in breathing since last OV. Pt c/o mild dry cough - pt states this is from seasonal allergies. Pt denies CP/tightness and f/c/s.     Follow-up  - Cough workup early in 2018 that resulted in discovery of pleural and pericardial effusion and left lower lobe lung nodule  Currently she is without cough and she is feeling good. She is chronic venous stasis edema. She had follow-up CT chest in May 2018 that showed resolution of the left lung nodule but she still has persistent right pleural loculated effusion and mild to moderate pericardial effusion. She saw Dr. Caryl Comes in April 2018 I reviewed his notes. He has recommended observation therapy for the pericardial effusion given her asymptomatic state and risk-benefit ratio procedures. Overall she's feeling well but she is very sedentary as always. [name] also my patient is with her    OV 09/22/2017  Chief Complaint  Patient presents with  . Follow-up    CXR done today to follow up on pleural effusion.  Pt  states that she has an occ. cough due to allergies, other than that has no complaints or concerns.    Cough workup feb in 2018 that resulted in discovery of pleural and pericardial effusion and left lower lobe lung nodule - resolved may 2018   Presnts for followup?> feels good. No resp complaints. Only issue is chronic pedal edema +++. Tries to elevate legs but not doing it correctly  Dg Chest 2 View  Result Date: 09/22/2017 CLINICAL DATA:  One year checkup. EXAM: CHEST  2 VIEW COMPARISON:  Chest CT 03/05/2017.  Chest x-ray 11/06/2016. FINDINGS: Mediastinum hilar structures are normal. Persistent mild right base atelectasis. Persistent small right pleural effusion with fluid noted in the fissures again noted. Small left pleural effusion. No pneumothorax. Stable cardiomegaly. No pulmonary venous congestion. Surgical coils in the upper abdomen . IMPRESSION: 1. Mild atelectasis again noted in the right lung base. Stable small right pleural effusion with fluid also noted the right major and minor fissures. Small left pleural effusion also noted. Similar findings noted on prior studies. 3. Stable cardiomegaly. Pericardial effusion noted on prior CT of 03/05/2017 . Electronically Signed   By: Marcello Moores  Register   On: 09/22/2017 10:02       has a past medical history of Atrial fibrillation ([place]) (2008), Dyslipidemia, HTN (hypertension), Hyperglycemia, Hyperthyroidism, Moderate mitral regurgitation by prior echocardiogram, Osteoarthritis, Osteoporosis, Stroke ([place]), and Tubulovillous adenoma  of colon (2002).   reports that  has never smoked. she has never used smokeless tobacco.  Past Surgical History:  Procedure Laterality Date  . ABDOMINAL HYSTERECTOMY    . COLON RESECTION  2006  . DEXA  12/29/2005  . right hemi-thyroidectomy    . stress cardiolite  09/20/2003    Allergies  Allergen Reactions  . Ace Inhibitors     REACTION: cough  . Codeine     Immunization History  Administered Date(s)  Administered  . Influenza Split 07/01/2011  . Influenza Whole 07/04/2008, 08/06/2009  . Influenza, High Dose Seasonal PF 09/10/2016, 08/01/2017  . Influenza,inj,Quad PF,6+ Mos 07/13/2013, 08/11/2014, 08/16/2015  . Pneumococcal Conjugate-13 08/11/2014  . Pneumococcal Polysaccharide-23 10/08/1999  . Td 07/06/2001  . Tdap 08/11/2014  . Varicella 08/11/2014    Family History  Problem Relation Age of Onset  . Lung cancer Paternal Uncle   . Pancreatic cancer Paternal Uncle   . Lung cancer Paternal Aunt      Current Outpatient Medications:  .  dabigatran (PRADAXA) 150 MG CAPS capsule, Take 1 capsule (150 mg total) by mouth 2 (two) times daily., Disp: 180 capsule, Rfl: 2 .  fluticasone (FLONASE) 50 MCG/ACT nasal spray, USE TWO SPRAY(S) IN EACH NOSTRIL ONCE DAILY, Disp: 25 g, Rfl: 2 .  latanoprost (XALATAN) 0.005 % ophthalmic solution, INSTILL ONE DROP INTO EACH EYE AT BEDTIME, Disp: 3 mL, Rfl: 2 .  loratadine (CLARITIN) 10 MG tablet, Take 10 mg by mouth daily., Disp: , Rfl:  .  losartan-hydrochlorothiazide (HYZAAR) 50-12.5 MG tablet, Take 1 tablet by mouth daily., Disp: 90 tablet, Rfl: 2 .  lovastatin (MEVACOR) 40 MG tablet, Take 2 tablets (80 mg total) by mouth at bedtime., Disp: 180 tablet, Rfl: 2       Review of Systems     Objective:   Physical Exam  Constitutional: She is oriented to person, place, and time. She appears well-developed and well-nourished. No distress.  short  HENT:  Head: Normocephalic and atraumatic.  Right Ear: External ear normal.  Left Ear: External ear normal.  Mouth/Throat: Oropharynx is clear and moist. No oropharyngeal exudate.  Eyes: Conjunctivae and EOM are normal. Pupils are equal, round, and reactive to light. Right eye exhibits no discharge. Left eye exhibits no discharge. No scleral icterus.  Neck: Normal range of motion. Neck supple. No JVD present. No tracheal deviation present. No thyromegaly present.  Cardiovascular: Normal rate, regular  rhythm, normal heart sounds and intact distal pulses. Exam reveals no gallop and no friction rub.  No murmur heard. Pulmonary/Chest: Effort normal and breath sounds normal. No respiratory distress. She has no wheezes. She has no rales. She exhibits no tenderness.  Abdominal: Soft. Bowel sounds are normal. She exhibits no distension and no mass. There is no tenderness. There is no rebound and no guarding.  Musculoskeletal: Normal range of motion. She exhibits edema. She exhibits no tenderness.  +++ edema  Lymphadenopathy:    She has no cervical adenopathy.  Neurological: She is alert and oriented to person, place, and time. She has normal reflexes. No cranial nerve deficit. She exhibits normal muscle tone. Coordination normal.  Skin: Skin is warm and dry. No rash noted. She is not diaphoretic. No erythema. No pallor.  Psychiatric: She has a normal mood and affect. Her behavior is normal. Judgment and thought content normal.  Vitals reviewed.  Vitals:   09/22/17 1019  BP: (!) 142/68  Pulse: 63  SpO2: 98%  Weight: 114 lb 9.6 oz (52 kg)  Height: 4\' 6"  (1.372 m)    Estimated body mass index is 27.63 kg/m as calculated from the following:   Height as of this encounter: 4\' 6"  (1.372 m).   Weight as of this encounter: 114 lb 9.6 oz (52 kg).     Assessment:       ICD-10-CM   1. Pleural effusion, right J90   2. Pedal edema R60.0        Plan:       Stable but refractory problem  Plan - sleep with legs elevated using a wedge pillow - use ted stockings in day for legs - no spefcific intervention for fluid in lungs other than cardiology support  Followup As needed   Dr. Brand Males, M.D., [facility].C.P Pulmonary and Critical Care Medicine Staff Physician, [place] Director - Interstitial Lung Disease  Program  Pulmonary [place] at [place], [place], [address]  Pager: [phone], If no answer or between  15:00h  - 7:00h: call [phone] Telephone: [phone]

## 2018-01-04 ENCOUNTER — Telehealth: Payer: Self-pay | Admitting: Internal Medicine

## 2018-01-04 ENCOUNTER — Encounter: Payer: Self-pay | Admitting: Endocrinology

## 2018-01-04 ENCOUNTER — Ambulatory Visit: Payer: Medicare PPO | Admitting: Endocrinology

## 2018-01-04 VITALS — BP 122/56 | HR 93 | Wt 113.8 lb

## 2018-01-04 DIAGNOSIS — L97909 Non-pressure chronic ulcer of unspecified part of unspecified lower leg with unspecified severity: Secondary | ICD-10-CM | POA: Insufficient documentation

## 2018-01-04 DIAGNOSIS — R609 Edema, unspecified: Secondary | ICD-10-CM | POA: Diagnosis not present

## 2018-01-04 DIAGNOSIS — L97901 Non-pressure chronic ulcer of unspecified part of unspecified lower leg limited to breakdown of skin: Secondary | ICD-10-CM | POA: Diagnosis not present

## 2018-01-04 LAB — URINALYSIS, ROUTINE W REFLEX MICROSCOPIC
Bilirubin Urine: NEGATIVE
Hgb urine dipstick: NEGATIVE
KETONES UR: NEGATIVE
Leukocytes, UA: NEGATIVE
Nitrite: NEGATIVE
PH: 6 (ref 5.0–8.0)
RBC / HPF: NONE SEEN (ref 0–?)
SPECIFIC GRAVITY, URINE: 1.015 (ref 1.000–1.030)
TOTAL PROTEIN, URINE-UPE24: NEGATIVE
UROBILINOGEN UA: 0.2 (ref 0.0–1.0)
Urine Glucose: NEGATIVE

## 2018-01-04 LAB — HEPATIC FUNCTION PANEL
ALT: 13 U/L (ref 0–35)
AST: 20 U/L (ref 0–37)
Albumin: 3.4 g/dL — ABNORMAL LOW (ref 3.5–5.2)
Alkaline Phosphatase: 61 U/L (ref 39–117)
Bilirubin, Direct: 0.1 mg/dL (ref 0.0–0.3)
TOTAL PROTEIN: 6 g/dL (ref 6.0–8.3)
Total Bilirubin: 0.4 mg/dL (ref 0.2–1.2)

## 2018-01-04 LAB — BASIC METABOLIC PANEL
BUN: 25 mg/dL — AB (ref 6–23)
CALCIUM: 9.5 mg/dL (ref 8.4–10.5)
CHLORIDE: 102 meq/L (ref 96–112)
CO2: 33 mEq/L — ABNORMAL HIGH (ref 19–32)
Creatinine, Ser: 0.89 mg/dL (ref 0.40–1.20)
GFR: 64.87 mL/min (ref >60.00)
Glucose, Bld: 101 mg/dL — ABNORMAL HIGH (ref 70–99)
Potassium: 4.5 mEq/L (ref 3.5–5.1)
Sodium: 142 mEq/L (ref 135–145)

## 2018-01-04 LAB — BRAIN NATRIURETIC PEPTIDE: Pro B Natriuretic peptide (BNP): 324 pg/mL — ABNORMAL HIGH (ref 0.0–100.0)

## 2018-01-04 MED ORDER — FUROSEMIDE 20 MG PO TABS: 20.0000 mg | ORAL_TABLET | Freq: Every day | ORAL | 3 refills | Status: AC

## 2018-01-04 MED ORDER — LOSARTAN POTASSIUM 50 MG PO TABS: 50.0000 mg | ORAL_TABLET | Freq: Every day | ORAL | 3 refills | Status: AC

## 2018-01-04 NOTE — Patient Instructions (Addendum)
Blood and urine tests are requested for you today.  We'll let you know about the results.   Based on the results, I hope to be able to prescribe another fluid pill for you.  This would require [place] to change the losartan-HCTZ to just losartan.  You will get better much faster if you elevate your feet above the rest of your body.  Please see a wound care specialist.  you will receive a phone call, about a day and time for an appointment.  Please come back for a follow-up appointment in 1-2 weeks.

## 2018-01-04 NOTE — Telephone Encounter (Signed)
Spoke with patient in regards to edema in legs along with seeping at ankles and red spots..   She thinks she may have been scratching them.. The edema started about 1.5 months ago and seeping 1 month ago..   She has no CP, but is intermittently SOB at times..  I recommended that she call her PCP and  also share the scenario.Marland Kitchen

## 2018-01-04 NOTE — Progress Notes (Signed)
Subjective:    Patient ID: Nicole Hutchinson, female    DOB: 1936/02/28, 82 y.o.   MRN: [id number]  HPI Pt states 2 mos of severe swelling of the legs, and assoc rash.   Past Medical History:  Diagnosis Date  . Atrial fibrillation ([place]) 2008   On Pradaxa  . Dyslipidemia   . HTN (hypertension)   . Hyperglycemia   . Hyperthyroidism   . Moderate mitral regurgitation by prior echocardiogram    Normal left ventricular function left atrial enlargement echo 2010  . Osteoarthritis   . Osteoporosis   . Stroke ([place])   . Tubulovillous adenoma of colon 2002    Past Surgical History:  Procedure Laterality Date  . ABDOMINAL HYSTERECTOMY    . COLON RESECTION  2006  . DEXA  12/29/2005  . right hemi-thyroidectomy    . stress cardiolite  09/20/2003    Social History   Socioeconomic History  . Marital status: Single    Spouse name: Not on file  . Number of children: Not on file  . Years of education: Not on file  . Highest education level: Not on file  Occupational History  . Occupation: Retired  Scientific laboratory technician  . Financial resource strain: Not on file  . Food insecurity:    Worry: Not on file    Inability: Not on file  . Transportation needs:    Medical: Not on file    Non-medical: Not on file  Tobacco Use  . Smoking status: Never Smoker  . Smokeless tobacco: Never Used  Substance and Sexual Activity  . Alcohol use: No    Alcohol/week: 0.0 oz  . Drug use: No  . Sexual activity: Not on file  Lifestyle  . Physical activity:    Days per week: Not on file    Minutes per session: Not on file  . Stress: Not on file  Relationships  . Social connections:    Talks on phone: Not on file    Gets together: Not on file    Attends religious service: Not on file    Active member of club or organization: Not on file    Attends meetings of clubs or organizations: Not on file    Relationship status: Not on file  . Intimate partner violence:    Fear of current or ex partner: Not on  file    Emotionally abused: Not on file    Physically abused: Not on file    Forced sexual activity: Not on file  Other Topics Concern  . Not on file  Social History Narrative   Retired.    Lives with sister, Zadie Rhine.     Current Outpatient Medications on File Prior to Visit  Medication Sig Dispense Refill  . dabigatran (PRADAXA) 150 MG CAPS capsule Take 1 capsule (150 mg total) by mouth 2 (two) times daily. 180 capsule 2  . fluticasone (FLONASE) 50 MCG/ACT nasal spray USE TWO SPRAY(S) IN EACH NOSTRIL ONCE DAILY 25 g 2  . latanoprost (XALATAN) 0.005 % ophthalmic solution INSTILL ONE DROP INTO EACH EYE AT BEDTIME 3 mL 2  . loratadine (CLARITIN) 10 MG tablet Take 10 mg by mouth daily.    Marland Kitchen lovastatin (MEVACOR) 40 MG tablet Take 2 tablets (80 mg total) by mouth at bedtime. 180 tablet 2   No current facility-administered medications on file prior to visit.     Allergies  Allergen Reactions  . Ace Inhibitors     REACTION: cough  . Codeine  Family History  Problem Relation Age of Onset  . Lung cancer Paternal Uncle   . Pancreatic cancer Paternal Uncle   . Lung cancer Paternal Aunt     BP (!) 122/56 (BP Location: Left Arm, Patient Position: Sitting, Cuff Size: Normal)   Pulse 93   Wt 113 lb 12.8 oz (51.6 kg)   SpO2 97%   BMI 27.44 kg/m    Review of Systems Denies sob and fever    Objective:   Physical Exam VITAL SIGNS:  See vs page GENERAL: no distress Ext: 2+ bilat leg edema There are bilat leg ulcers (3 cm diameter).      Assessment & Plan:  Chronic leg edema, worse for uncertain reason.  Check venous dopplers.  Leg ulcers, new.    Patient Instructions  Blood and urine tests are requested for you today.  We'll let you know about the results.   Based on the results, I hope to be able to prescribe another fluid pill for you.  This would require [place] to change the losartan-HCTZ to just losartan.  You will get better much faster if you elevate your feet  above the rest of your body.  Please see a wound care specialist.  you will receive a phone call, about a day and time for an appointment.  Please come back for a follow-up appointment in 1-2 weeks.

## 2018-01-04 NOTE — Telephone Encounter (Signed)
New Message  Pt c/o swelling: STAT is pt has developed SOB within 24 hours  1) How much weight have you gained and in what time span? nonoe  2) If swelling, where is the swelling located? legs  3) Are you currently taking a fluid pill? yes  4) Are you currently SOB? At times  5) Do you have a log of your daily weights (if so, list)? no  6) Have you gained 3 pounds in a day or 5 pounds in a week? no  7) Have you traveled recently? no

## 2018-01-05 ENCOUNTER — Encounter (HOSPITAL_BASED_OUTPATIENT_CLINIC_OR_DEPARTMENT_OTHER): Payer: Medicare PPO | Attending: Internal Medicine

## 2018-01-05 ENCOUNTER — Telehealth: Payer: Self-pay | Admitting: Endocrinology

## 2018-01-05 DIAGNOSIS — L97822 Non-pressure chronic ulcer of other part of left lower leg with fat layer exposed: Secondary | ICD-10-CM | POA: Insufficient documentation

## 2018-01-05 DIAGNOSIS — L97212 Non-pressure chronic ulcer of right calf with fat layer exposed: Secondary | ICD-10-CM | POA: Insufficient documentation

## 2018-01-05 DIAGNOSIS — I87333 Chronic venous hypertension (idiopathic) with ulcer and inflammation of bilateral lower extremity: Secondary | ICD-10-CM | POA: Diagnosis present

## 2018-01-05 DIAGNOSIS — L97812 Non-pressure chronic ulcer of other part of right lower leg with fat layer exposed: Secondary | ICD-10-CM | POA: Diagnosis not present

## 2018-01-05 DIAGNOSIS — L97222 Non-pressure chronic ulcer of left calf with fat layer exposed: Secondary | ICD-10-CM | POA: Diagnosis not present

## 2018-01-05 DIAGNOSIS — I10 Essential (primary) hypertension: Secondary | ICD-10-CM | POA: Insufficient documentation

## 2018-01-05 DIAGNOSIS — L03115 Cellulitis of right lower limb: Secondary | ICD-10-CM | POA: Diagnosis not present

## 2018-01-05 NOTE — Telephone Encounter (Signed)
Patient is calling about lab results from yesterdays labs

## 2018-01-06 ENCOUNTER — Other Ambulatory Visit: Payer: Self-pay | Admitting: Endocrinology

## 2018-01-07 ENCOUNTER — Other Ambulatory Visit: Payer: Self-pay

## 2018-01-07 ENCOUNTER — Telehealth: Payer: Self-pay

## 2018-01-07 DIAGNOSIS — L97901 Non-pressure chronic ulcer of unspecified part of unspecified lower leg limited to breakdown of skin: Secondary | ICD-10-CM

## 2018-01-07 NOTE — Telephone Encounter (Signed)
I called and gave patient's sister results.

## 2018-01-07 NOTE — Telephone Encounter (Signed)
I have spoke with Ms. Powers to let her know that I scheduled patient a venous doppler for tomorrow at 1:30 at [facility] Office.

## 2018-01-08 ENCOUNTER — Ambulatory Visit (HOSPITAL_COMMUNITY)
Admission: RE | Admit: 2018-01-08 | Discharge: 2018-01-08 | Disposition: A | Discharge status: Home / Self Care | Payer: Medicare PPO | Source: Ambulatory Visit | Attending: Cardiovascular Disease | Admitting: Cardiovascular Disease

## 2018-01-08 ENCOUNTER — Other Ambulatory Visit: Payer: Self-pay

## 2018-01-08 ENCOUNTER — Other Ambulatory Visit: Payer: Self-pay | Admitting: Endocrinology

## 2018-01-08 DIAGNOSIS — R600 Localized edema: Secondary | ICD-10-CM

## 2018-01-08 MED ORDER — DABIGATRAN ETEXILATE MESYLATE 150 MG PO CAPS: 150.0000 mg | ORAL_CAPSULE | Freq: Two times a day (BID) | ORAL | 2 refills | Status: AC

## 2018-01-08 MED ORDER — LOVASTATIN 40 MG PO TABS: 80.0000 mg | ORAL_TABLET | Freq: Every day | ORAL | 2 refills | Status: AC

## 2018-01-11 ENCOUNTER — Telehealth: Payer: Self-pay | Admitting: Endocrinology

## 2018-01-11 NOTE — Telephone Encounter (Signed)
FYI [facility] called in regards to patient.  They wanted to let the Dr know they went out on Saturday and placed wraps on each of patients legs.   And that Patient had fell but has no injuries. They stated she got up to walk without her walker when she fell

## 2018-01-12 ENCOUNTER — Telehealth: Payer: Self-pay | Admitting: Endocrinology

## 2018-01-12 DIAGNOSIS — I87333 Chronic venous hypertension (idiopathic) with ulcer and inflammation of bilateral lower extremity: Secondary | ICD-10-CM | POA: Diagnosis not present

## 2018-01-12 NOTE — Telephone Encounter (Signed)
[facility] is calling in regards to patient.  They would like to know if Dr Loanne Drilling could put in orders for Physical Therapy Evaluation.   '   [phone] [facility]

## 2018-01-12 NOTE — Telephone Encounter (Signed)
OK 

## 2018-01-13 NOTE — Telephone Encounter (Signed)
I spoke with Estill Bamberg & gave verbal orders.

## 2018-01-14 NOTE — Telephone Encounter (Signed)
Noted  

## 2018-01-15 ENCOUNTER — Ambulatory Visit: Payer: Medicare PPO | Admitting: Endocrinology

## 2018-01-18 ENCOUNTER — Ambulatory Visit: Payer: Medicare PPO | Admitting: Endocrinology

## 2018-01-18 ENCOUNTER — Encounter (HOSPITAL_BASED_OUTPATIENT_CLINIC_OR_DEPARTMENT_OTHER): Payer: Medicare PPO

## 2018-01-19 ENCOUNTER — Telehealth: Payer: Self-pay | Admitting: Endocrinology

## 2018-01-19 DIAGNOSIS — I87333 Chronic venous hypertension (idiopathic) with ulcer and inflammation of bilateral lower extremity: Secondary | ICD-10-CM | POA: Diagnosis not present

## 2018-01-19 NOTE — Telephone Encounter (Signed)
OK 

## 2018-01-19 NOTE — Telephone Encounter (Signed)
Nicole Hutchinson with [facility] ph# [phone] needs Dr. Stormy Fabian to give a verbal okay for physical therapy 1 x per week for 4 weeks. Please call the above number.

## 2018-01-19 NOTE — Telephone Encounter (Signed)
LVM stating that I was calling to give her verbal orders for patient & to call back if anything further was needed or she needed to speak to me directly.

## 2018-01-20 ENCOUNTER — Ambulatory Visit: Payer: Medicare PPO | Admitting: Endocrinology

## 2018-01-20 ENCOUNTER — Encounter: Payer: Self-pay | Admitting: Endocrinology

## 2018-01-20 VITALS — BP 122/68 | HR 97 | Resp 16 | Wt 119.4 lb

## 2018-01-20 DIAGNOSIS — M810 Age-related osteoporosis without current pathological fracture: Secondary | ICD-10-CM | POA: Diagnosis not present

## 2018-01-20 DIAGNOSIS — Z0000 Encounter for general adult medical examination without abnormal findings: Secondary | ICD-10-CM

## 2018-01-20 NOTE — Progress Notes (Signed)
we discussed code status.  pt requests full code, but would not want to be started or maintained on artificial life-support measures if there was not a reasonable chance of recovery 

## 2018-01-20 NOTE — Progress Notes (Signed)
Subjective:    Patient ID: Nicole Hutchinson, female    DOB: Jul 17, 1936, 82 y.o.   MRN: [id number]  HPI  Pt ret for f/u of HTN: edema is unchanged. she has doe.   Past Medical History:  Diagnosis Date  . Atrial fibrillation ([place]) 2008   On Pradaxa  . Dyslipidemia   . HTN (hypertension)   . Hyperglycemia   . Hyperthyroidism   . Moderate mitral regurgitation by prior echocardiogram    Normal left ventricular function left atrial enlargement echo 2010  . Osteoarthritis   . Osteoporosis   . Stroke ([place])   . Tubulovillous adenoma of colon 2002    Past Surgical History:  Procedure Laterality Date  . ABDOMINAL HYSTERECTOMY    . COLON RESECTION  2006  . DEXA  12/29/2005  . right hemi-thyroidectomy    . stress cardiolite  09/20/2003    Social History   Socioeconomic History  . Marital status: Single    Spouse name: Not on file  . Number of children: Not on file  . Years of education: Not on file  . Highest education level: Not on file  Occupational History  . Occupation: Retired  Scientific laboratory technician  . Financial resource strain: Not on file  . Food insecurity:    Worry: Not on file    Inability: Not on file  . Transportation needs:    Medical: Not on file    Non-medical: Not on file  Tobacco Use  . Smoking status: Never Smoker  . Smokeless tobacco: Never Used  Substance and Sexual Activity  . Alcohol use: No    Alcohol/week: 0.0 oz  . Drug use: No  . Sexual activity: Not on file  Lifestyle  . Physical activity:    Days per week: Not on file    Minutes per session: Not on file  . Stress: Not on file  Relationships  . Social connections:    Talks on phone: Not on file    Gets together: Not on file    Attends religious service: Not on file    Active member of club or organization: Not on file    Attends meetings of clubs or organizations: Not on file    Relationship status: Not on file  . Intimate partner violence:    Fear of current or ex partner: Not on file   Emotionally abused: Not on file    Physically abused: Not on file    Forced sexual activity: Not on file  Other Topics Concern  . Not on file  Social History Narrative   Retired.    Lives with sister, Zadie Rhine.     Current Outpatient Medications on File Prior to Visit  Medication Sig Dispense Refill  . dabigatran (PRADAXA) 150 MG CAPS capsule Take 1 capsule (150 mg total) by mouth 2 (two) times daily. 180 capsule 2  . fluticasone (FLONASE) 50 MCG/ACT nasal spray USE 2 SPRAYS IN EACH NOSTRIL EVERY DAY 48 g 2  . furosemide (LASIX) 20 MG tablet Take 1 tablet (20 mg total) by mouth daily. 90 tablet 3  . latanoprost (XALATAN) 0.005 % ophthalmic solution INSTILL ONE DROP INTO EACH EYE AT BEDTIME 3 mL 2  . loratadine (CLARITIN) 10 MG tablet Take 10 mg by mouth daily.    Marland Kitchen losartan (COZAAR) 50 MG tablet Take 1 tablet (50 mg total) by mouth daily. 90 tablet 3  . lovastatin (MEVACOR) 40 MG tablet Take 2 tablets (80 mg total) by mouth at  bedtime. 180 tablet 2   No current facility-administered medications on file prior to visit.     Allergies  Allergen Reactions  . Ace Inhibitors     REACTION: cough  . Codeine     Family History  Problem Relation Age of Onset  . Lung cancer Paternal Uncle   . Pancreatic cancer Paternal Uncle   . Lung cancer Paternal Aunt     BP 122/68   Pulse 97   Resp 16   Wt 119 lb 6.4 oz (54.2 kg)   SpO2 98%   BMI 28.79 kg/m    Review of Systems Denies falls    Objective:   Physical Exam VITAL SIGNS:  See vs page GENERAL: no distress LUNGS:  Clear to auscultation, except for rales at both bases.   Ext: 1+ bilat leg edema.       Assessment & Plan:  Edema: stable.  HTN: well-controlled. Please continue the same medications.   Subjective:   Patient here for Medicare annual wellness visit and management of other chronic and acute problems.     Risk factors: advanced age    10 of Physicians Providing Medical Care to Patient:  See  "snapshot"   Activities of Daily Living: In your present state of health, do you have any difficulty performing the following activities (lives with sister)?:  Preparing food and eating?: No  Bathing yourself: No  Getting dressed: No  Using the toilet:No  Moving around from place to place: No  In the past year have you fallen or had a near fall?: No    Home Safety: Has smoke detector and wears seat belts. No firearms. No excess sun exposure.    Opioid Use: none   Diet and Exercise  Current exercise habits: limited by health probs Dietary issues discussed: pt reports a healthy diet   Depression Screen  Q1: Over the past two weeks, have you felt down, depressed or hopeless? no  Q2: Over the past two weeks, have you felt little interest or pleasure in doing things? no   The following portions of the patient's history were reviewed and updated as appropriate: allergies, current medications, past family history, past medical history, past social history, past surgical history and problem list.   Review of Systems  No change in chronic hearing loss or visual loss.   Objective:   Vision:  Advertising account executive, so he declines VA today. Hearing: grossly normal Body mass index:  See vs page Msk: pt easily and quickly performs "get-up-and-go" from a sitting position.   Cognitive Impairment Assessment: cognition, memory and judgment appear normal.  remembers 3/3 at 5 minutes.  excellent recall.  can easily read and write a sentence.  alert and oriented x 3.     Assessment:   Medicare wellness utd on preventive parameters    Plan:   During the course of the visit the patient was educated and counseled about appropriate screening and preventive services including:        Fall prevention is advised today  Screening mammography is ordered Bone densitometry screening is ordered Diabetes screening is UTD Nutrition counseling is offered  advanced directives/end of life addressed today:   see healthcare directives hyperlink  Vaccines are updated as needed  Patient Instructions (the written plan) was given to the patient.

## 2018-01-20 NOTE — Patient Instructions (Addendum)
Please continue the same medications.  Please come back for a follow-up appointment in 3-4 months.  good diet and exercise significantly improves your health.  please let me know if you wish to be referred to a dietician.  high blood sugar is very risky to your health.  you should see an eye doctor and dentist every year.  It is very important to get all recommended vaccinations.  Please consider these measures for your health:  minimize alcohol.  Do not use tobacco products.  Have a colonoscopy at least every 10 years from age 58.  Women should have an annual mammogram from age 68.  Keep firearms safely stored.  Always use seat belts.  have working smoke alarms in your home.  See an eye doctor and dentist regularly.  Never drive under the influence of alcohol or drugs (including prescription drugs).  Those with fair skin should take precautions against the sun, and should carefully examine their skin once per month, for any new or changed moles. It is critically important to prevent falling down (keep floor areas well-lit, dry, and free of loose objects.  If you have a cane, walker, or wheelchair, you should use it, even for short trips around the house.  Wear flat-soled shoes.  Also, try not to rush).

## 2018-01-22 ENCOUNTER — Ambulatory Visit: Payer: Medicare PPO | Admitting: Internal Medicine

## 2018-01-26 DIAGNOSIS — I87333 Chronic venous hypertension (idiopathic) with ulcer and inflammation of bilateral lower extremity: Secondary | ICD-10-CM | POA: Diagnosis not present

## 2018-02-02 DIAGNOSIS — I87333 Chronic venous hypertension (idiopathic) with ulcer and inflammation of bilateral lower extremity: Secondary | ICD-10-CM | POA: Diagnosis not present

## 2018-02-08 ENCOUNTER — Telehealth: Payer: Self-pay | Admitting: Internal Medicine

## 2018-02-08 NOTE — Telephone Encounter (Signed)
Spoke with pt's sister [place]. She is concerned with pt's ongoing SOB. Pt recently has seen her PCP who started her on lasix. Curly Shores denies weight gain and states her leg swelling has gotten better despite weeping. Wound care is on board with LE weeping management. Primary care also ordered Bilateral LE doppler which was negative. Curly Shores states her sister "Only wants to see Dr Caryl Comes and wants to get worked in sooner." I advised her she has an appointment for early June and is currently on the wait list if an earlier visit opens up. I advised her based off her sisters last ECHO (LV function 60-65%), and no weight gain I am more concerned her SOB could be pulmonary. Curly Shores states she "sees a lung doctor" and I agree it would be appropriate to seek their opinion. I also encouraged her to continue following up with her PCP as they are doing an excellent job with her care and appropriate interventions for pt's current symptoms.  Pt will continue on wait list and keep her current yearly follow up with Dr Caryl Comes for June.

## 2018-02-08 NOTE — Telephone Encounter (Signed)
New Message:      Pt's sister is calling to see if we can work the pt in. Pt is having weeping legs and some SOB but she states pt only wants to see klein and does not want to go to the hosp.

## 2018-02-09 ENCOUNTER — Telehealth: Payer: Self-pay | Admitting: Internal Medicine

## 2018-02-09 ENCOUNTER — Emergency Department (HOSPITAL_COMMUNITY): Payer: Medicare PPO

## 2018-02-09 ENCOUNTER — Other Ambulatory Visit: Payer: Self-pay

## 2018-02-09 ENCOUNTER — Encounter (HOSPITAL_COMMUNITY): Payer: Self-pay | Admitting: Emergency Medicine

## 2018-02-09 ENCOUNTER — Inpatient Hospital Stay (HOSPITAL_COMMUNITY)
Admission: EM | Admit: 2018-02-09 | Discharge: 2018-03-06 | DRG: 871 | Disposition: E | Payer: Medicare PPO | Attending: Emergency Medicine | Admitting: Emergency Medicine

## 2018-02-09 ENCOUNTER — Encounter (HOSPITAL_BASED_OUTPATIENT_CLINIC_OR_DEPARTMENT_OTHER): Payer: Medicare PPO | Attending: Internal Medicine

## 2018-02-09 DIAGNOSIS — D62 Acute posthemorrhagic anemia: Secondary | ICD-10-CM

## 2018-02-09 DIAGNOSIS — Z885 Allergy status to narcotic agent status: Secondary | ICD-10-CM | POA: Diagnosis not present

## 2018-02-09 DIAGNOSIS — N179 Acute kidney failure, unspecified: Secondary | ICD-10-CM | POA: Diagnosis not present

## 2018-02-09 DIAGNOSIS — D500 Iron deficiency anemia secondary to blood loss (chronic): Secondary | ICD-10-CM | POA: Diagnosis not present

## 2018-02-09 DIAGNOSIS — E890 Postprocedural hypothyroidism: Secondary | ICD-10-CM | POA: Diagnosis present

## 2018-02-09 DIAGNOSIS — I340 Nonrheumatic mitral (valve) insufficiency: Secondary | ICD-10-CM | POA: Diagnosis present

## 2018-02-09 DIAGNOSIS — R579 Shock, unspecified: Secondary | ICD-10-CM | POA: Diagnosis not present

## 2018-02-09 DIAGNOSIS — R195 Other fecal abnormalities: Secondary | ICD-10-CM | POA: Diagnosis not present

## 2018-02-09 DIAGNOSIS — J939 Pneumothorax, unspecified: Secondary | ICD-10-CM | POA: Diagnosis not present

## 2018-02-09 DIAGNOSIS — R7989 Other specified abnormal findings of blood chemistry: Secondary | ICD-10-CM

## 2018-02-09 DIAGNOSIS — L03115 Cellulitis of right lower limb: Secondary | ICD-10-CM | POA: Diagnosis present

## 2018-02-09 DIAGNOSIS — Z7901 Long term (current) use of anticoagulants: Secondary | ICD-10-CM

## 2018-02-09 DIAGNOSIS — Z85038 Personal history of other malignant neoplasm of large intestine: Secondary | ICD-10-CM

## 2018-02-09 DIAGNOSIS — D509 Iron deficiency anemia, unspecified: Secondary | ICD-10-CM | POA: Diagnosis present

## 2018-02-09 DIAGNOSIS — I313 Pericardial effusion (noninflammatory): Secondary | ICD-10-CM | POA: Diagnosis present

## 2018-02-09 DIAGNOSIS — A419 Sepsis, unspecified organism: Secondary | ICD-10-CM | POA: Diagnosis present

## 2018-02-09 DIAGNOSIS — E785 Hyperlipidemia, unspecified: Secondary | ICD-10-CM | POA: Diagnosis present

## 2018-02-09 DIAGNOSIS — Z79899 Other long term (current) drug therapy: Secondary | ICD-10-CM | POA: Diagnosis not present

## 2018-02-09 DIAGNOSIS — Z90710 Acquired absence of both cervix and uterus: Secondary | ICD-10-CM | POA: Diagnosis not present

## 2018-02-09 DIAGNOSIS — G9341 Metabolic encephalopathy: Secondary | ICD-10-CM | POA: Diagnosis present

## 2018-02-09 DIAGNOSIS — R34 Anuria and oliguria: Secondary | ICD-10-CM | POA: Diagnosis present

## 2018-02-09 DIAGNOSIS — R748 Abnormal levels of other serum enzymes: Secondary | ICD-10-CM | POA: Diagnosis not present

## 2018-02-09 DIAGNOSIS — I272 Pulmonary hypertension, unspecified: Secondary | ICD-10-CM | POA: Diagnosis present

## 2018-02-09 DIAGNOSIS — K922 Gastrointestinal hemorrhage, unspecified: Secondary | ICD-10-CM | POA: Diagnosis not present

## 2018-02-09 DIAGNOSIS — J90 Pleural effusion, not elsewhere classified: Secondary | ICD-10-CM

## 2018-02-09 DIAGNOSIS — I4891 Unspecified atrial fibrillation: Secondary | ICD-10-CM | POA: Diagnosis present

## 2018-02-09 DIAGNOSIS — L03119 Cellulitis of unspecified part of limb: Secondary | ICD-10-CM | POA: Diagnosis present

## 2018-02-09 DIAGNOSIS — D72829 Elevated white blood cell count, unspecified: Secondary | ICD-10-CM | POA: Diagnosis present

## 2018-02-09 DIAGNOSIS — Z09 Encounter for follow-up examination after completed treatment for conditions other than malignant neoplasm: Secondary | ICD-10-CM

## 2018-02-09 DIAGNOSIS — M810 Age-related osteoporosis without current pathological fracture: Secondary | ICD-10-CM | POA: Diagnosis present

## 2018-02-09 DIAGNOSIS — I3139 Other pericardial effusion (noninflammatory): Secondary | ICD-10-CM | POA: Diagnosis present

## 2018-02-09 DIAGNOSIS — Z4682 Encounter for fitting and adjustment of non-vascular catheter: Secondary | ICD-10-CM

## 2018-02-09 DIAGNOSIS — K921 Melena: Secondary | ICD-10-CM | POA: Diagnosis present

## 2018-02-09 DIAGNOSIS — I481 Persistent atrial fibrillation: Secondary | ICD-10-CM | POA: Diagnosis not present

## 2018-02-09 DIAGNOSIS — R0602 Shortness of breath: Secondary | ICD-10-CM | POA: Diagnosis present

## 2018-02-09 DIAGNOSIS — T45515A Adverse effect of anticoagulants, initial encounter: Secondary | ICD-10-CM | POA: Diagnosis present

## 2018-02-09 DIAGNOSIS — Z66 Do not resuscitate: Secondary | ICD-10-CM | POA: Diagnosis present

## 2018-02-09 DIAGNOSIS — J9690 Respiratory failure, unspecified, unspecified whether with hypoxia or hypercapnia: Secondary | ICD-10-CM

## 2018-02-09 DIAGNOSIS — I517 Cardiomegaly: Secondary | ICD-10-CM

## 2018-02-09 DIAGNOSIS — R739 Hyperglycemia, unspecified: Secondary | ICD-10-CM | POA: Diagnosis present

## 2018-02-09 DIAGNOSIS — R609 Edema, unspecified: Secondary | ICD-10-CM | POA: Diagnosis present

## 2018-02-09 DIAGNOSIS — M7989 Other specified soft tissue disorders: Secondary | ICD-10-CM | POA: Diagnosis not present

## 2018-02-09 DIAGNOSIS — I482 Chronic atrial fibrillation: Secondary | ICD-10-CM | POA: Diagnosis present

## 2018-02-09 DIAGNOSIS — Z888 Allergy status to other drugs, medicaments and biological substances status: Secondary | ICD-10-CM

## 2018-02-09 DIAGNOSIS — E871 Hypo-osmolality and hyponatremia: Secondary | ICD-10-CM | POA: Diagnosis present

## 2018-02-09 DIAGNOSIS — R6521 Severe sepsis with septic shock: Secondary | ICD-10-CM | POA: Diagnosis present

## 2018-02-09 DIAGNOSIS — R6510 Systemic inflammatory response syndrome (SIRS) of non-infectious origin without acute organ dysfunction: Secondary | ICD-10-CM | POA: Diagnosis present

## 2018-02-09 DIAGNOSIS — Z8673 Personal history of transient ischemic attack (TIA), and cerebral infarction without residual deficits: Secondary | ICD-10-CM | POA: Diagnosis not present

## 2018-02-09 DIAGNOSIS — I361 Nonrheumatic tricuspid (valve) insufficiency: Secondary | ICD-10-CM | POA: Diagnosis not present

## 2018-02-09 DIAGNOSIS — R0902 Hypoxemia: Secondary | ICD-10-CM

## 2018-02-09 DIAGNOSIS — I2721 Secondary pulmonary arterial hypertension: Secondary | ICD-10-CM | POA: Diagnosis present

## 2018-02-09 DIAGNOSIS — J9601 Acute respiratory failure with hypoxia: Secondary | ICD-10-CM | POA: Diagnosis present

## 2018-02-09 DIAGNOSIS — I10 Essential (primary) hypertension: Secondary | ICD-10-CM | POA: Diagnosis present

## 2018-02-09 DIAGNOSIS — R778 Other specified abnormalities of plasma proteins: Secondary | ICD-10-CM

## 2018-02-09 LAB — BLOOD GAS, ARTERIAL
Acid-Base Excess: 3.7 mmol/L — ABNORMAL HIGH (ref 0.0–2.0)
Bicarbonate: 29.4 mmol/L — ABNORMAL HIGH (ref 20.0–28.0)
Drawn by: 441261
O2 Content: 6 L/min
O2 Saturation: 98.4 %
PCO2 ART: 55.6 mmHg — AB (ref 32.0–48.0)
PH ART: 7.343 — AB (ref 7.350–7.450)
Patient temperature: 98.6
pO2, Arterial: 127 mmHg — ABNORMAL HIGH (ref 83.0–108.0)

## 2018-02-09 LAB — I-STAT TROPONIN, ED: TROPONIN I, POC: 0.1 ng/mL — AB (ref 0.00–0.08)

## 2018-02-09 LAB — URINALYSIS, ROUTINE W REFLEX MICROSCOPIC
BILIRUBIN URINE: NEGATIVE
Glucose, UA: NEGATIVE mg/dL
HGB URINE DIPSTICK: NEGATIVE
Ketones, ur: NEGATIVE mg/dL
Leukocytes, UA: NEGATIVE
Nitrite: NEGATIVE
PROTEIN: NEGATIVE mg/dL
Specific Gravity, Urine: 1.012 (ref 1.005–1.030)
pH: 5 (ref 5.0–8.0)

## 2018-02-09 LAB — CBC WITH DIFFERENTIAL/PLATELET
BASOS PCT: 0 %
Basophils Absolute: 0 10*3/uL (ref 0.0–0.1)
EOS PCT: 0 %
Eosinophils Absolute: 0 10*3/uL (ref 0.0–0.7)
HCT: 28.1 % — ABNORMAL LOW (ref 36.0–46.0)
Hemoglobin: 8.2 g/dL — ABNORMAL LOW (ref 12.0–15.0)
LYMPHS PCT: 4 %
Lymphs Abs: 1.7 10*3/uL (ref 0.7–4.0)
MCH: 19.2 pg — AB (ref 26.0–34.0)
MCHC: 29.2 g/dL — ABNORMAL LOW (ref 30.0–36.0)
MCV: 65.7 fL — AB (ref 78.0–100.0)
MONO ABS: 2.9 10*3/uL — AB (ref 0.1–1.0)
Monocytes Relative: 7 %
NEUTROS PCT: 89 %
Neutro Abs: 37.2 10*3/uL — ABNORMAL HIGH (ref 1.7–7.7)
PLATELETS: 222 10*3/uL (ref 150–400)
RBC: 4.28 MIL/uL (ref 3.87–5.11)
RDW: 19 % — ABNORMAL HIGH (ref 11.5–15.5)
WBC: 41.8 10*3/uL — AB (ref 4.0–10.5)

## 2018-02-09 LAB — I-STAT CG4 LACTIC ACID, ED: LACTIC ACID, VENOUS: 2.06 mmol/L — AB (ref 0.5–1.9)

## 2018-02-09 LAB — COMPREHENSIVE METABOLIC PANEL
ALT: 15 U/L (ref 14–54)
ANION GAP: 12 (ref 5–15)
AST: 19 U/L (ref 15–41)
Albumin: 2.9 g/dL — ABNORMAL LOW (ref 3.5–5.0)
Alkaline Phosphatase: 78 U/L (ref 38–126)
BUN: 23 mg/dL — ABNORMAL HIGH (ref 6–20)
CHLORIDE: 95 mmol/L — AB (ref 101–111)
CO2: 27 mmol/L (ref 22–32)
CREATININE: 1.01 mg/dL — AB (ref 0.44–1.00)
Calcium: 8.5 mg/dL — ABNORMAL LOW (ref 8.9–10.3)
GFR, EST AFRICAN AMERICAN: 59 mL/min — AB (ref >60)
GFR, EST NON AFRICAN AMERICAN: 51 mL/min — AB (ref >60)
Glucose, Bld: 126 mg/dL — ABNORMAL HIGH (ref 65–99)
POTASSIUM: 4.1 mmol/L (ref 3.5–5.1)
SODIUM: 134 mmol/L — AB (ref 135–145)
Total Bilirubin: 0.5 mg/dL (ref 0.3–1.2)
Total Protein: 5.9 g/dL — ABNORMAL LOW (ref 6.5–8.1)

## 2018-02-09 LAB — BRAIN NATRIURETIC PEPTIDE: B Natriuretic Peptide: 456.1 pg/mL — ABNORMAL HIGH (ref 0.0–100.0)

## 2018-02-09 LAB — POC OCCULT BLOOD, ED: Fecal Occult Bld: POSITIVE — AB

## 2018-02-09 LAB — ABO/RH: ABO/RH(D): O POS

## 2018-02-09 MED ORDER — FUROSEMIDE 10 MG/ML IJ SOLN
60.0000 mg | Freq: Once | INTRAMUSCULAR | Status: AC
  Administered 2018-02-09: 60 mg via INTRAVENOUS
  Filled 2018-02-09: qty 8

## 2018-02-09 MED ORDER — VANCOMYCIN HCL IN DEXTROSE 1-5 GM/200ML-% IV SOLN
1000.0000 mg | Freq: Once | INTRAVENOUS | Status: AC
  Administered 2018-02-09: 1000 mg via INTRAVENOUS
  Filled 2018-02-09: qty 200

## 2018-02-09 MED ORDER — ALBUTEROL SULFATE (2.5 MG/3ML) 0.083% IN NEBU
5.0000 mg | INHALATION_SOLUTION | Freq: Once | RESPIRATORY_TRACT | Status: AC
  Administered 2018-02-09: 5 mg via RESPIRATORY_TRACT
  Filled 2018-02-09: qty 6

## 2018-02-09 MED ORDER — CEFAZOLIN SODIUM-DEXTROSE 1-4 GM/50ML-% IV SOLN
1.0000 g | Freq: Once | INTRAVENOUS | Status: AC
  Administered 2018-02-09: 1 g via INTRAVENOUS
  Filled 2018-02-09: qty 50

## 2018-02-09 NOTE — ED Notes (Signed)
ED TO INPATIENT HANDOFF REPORT  Name/Age/Gender Nicole Hutchinson 82 y.o. female  Code Status   Home/SNF/Other Home  Chief Complaint SHOB   Level of Care/Admitting Diagnosis ED Disposition    ED Disposition Condition [place]: [place] [[id number]]  Level of Care: Telemetry [5]  Admit to tele based on following criteria: Other see comments  Comments: gi bleed  Diagnosis: SIRS (systemic inflammatory response syndrome) ([place]) [130865]  Admitting Physician: Elwyn Reach [[id number]]  Attending Physician: Elwyn Reach [[id number]]  Estimated length of stay: past midnight tomorrow  Certification:: I certify this patient will need inpatient services for at least 2 midnights  PT Class (Do Not Modify): Inpatient [101]  PT Acc Code (Do Not Modify): Private [1]       Medical History Past Medical History:  Diagnosis Date  . Atrial fibrillation ([place]) 2008   On Pradaxa  . Dyslipidemia   . HTN (hypertension)   . Hyperglycemia   . Hyperthyroidism   . Moderate mitral regurgitation by prior echocardiogram    Normal left ventricular function left atrial enlargement echo 2010  . Osteoarthritis   . Osteoporosis   . Stroke ([place])   . Tubulovillous adenoma of colon 2002    Allergies Allergies  Allergen Reactions  . Ace Inhibitors     REACTION: cough  . Codeine     IV Location/Drains/Wounds Patient Lines/Drains/Airways Status   Active Line/Drains/Airways    Name:   Placement date:   Placement time:   Site:   Days:   Peripheral IV 03/02/2018 Right Antecubital   03/01/2018    2041    Antecubital   less than 1          Labs/Imaging Results for orders placed or performed during the hospital encounter of 02/28/2018 (from the past 48 hour(s))  I-stat troponin, ED     Status: Abnormal   Collection Time: 02/14/2018  3:29 PM  Result Value Ref Range   Troponin i, poc 0.10 (HH) 0.00 - 0.08 ng/mL   Comment NOTIFIED PHYSICIAN    Comment 3            Comment: Due to the release kinetics of cTnI, a negative result within the first hours of the onset of symptoms does not rule out myocardial infarction with certainty. If myocardial infarction is still suspected, repeat the test at appropriate intervals.   Comprehensive metabolic panel     Status: Abnormal   Collection Time: 02/23/2018  4:05 PM  Result Value Ref Range   Sodium 134 (L) 135 - 145 mmol/L   Potassium 4.1 3.5 - 5.1 mmol/L   Chloride 95 (L) 101 - 111 mmol/L   CO2 27 22 - 32 mmol/L   Glucose, Bld 126 (H) 65 - 99 mg/dL   BUN 23 (H) 6 - 20 mg/dL   Creatinine, Ser 1.01 (H) 0.44 - 1.00 mg/dL   Calcium 8.5 (L) 8.9 - 10.3 mg/dL   Total Protein 5.9 (L) 6.5 - 8.1 g/dL   Albumin 2.9 (L) 3.5 - 5.0 g/dL   AST 19 15 - 41 U/L   ALT 15 14 - 54 U/L   Alkaline Phosphatase 78 38 - 126 U/L   Total Bilirubin 0.5 0.3 - 1.2 mg/dL   GFR calc non Af Amer 51 (L) >60 mL/min   GFR calc Af Amer 59 (L) >60 mL/min    Comment: (NOTE) The eGFR has been calculated using the CKD EPI equation. This calculation has  not been validated in all clinical situations. eGFR's persistently <60 mL/min signify possible Chronic Kidney Disease.    Anion gap 12 5 - 15    Comment: Performed at [facility], [place] [address], [place], [place] [address]  CBC with Differential     Status: Abnormal   Collection Time: 02/13/2018  4:05 PM  Result Value Ref Range   WBC 41.8 (H) 4.0 - 10.5 K/uL   RBC 4.28 3.87 - 5.11 MIL/uL   Hemoglobin 8.2 (L) 12.0 - 15.0 g/dL   HCT 28.1 (L) 36.0 - 46.0 %   MCV 65.7 (L) 78.0 - 100.0 fL   MCH 19.2 (L) 26.0 - 34.0 pg   MCHC 29.2 (L) 30.0 - 36.0 g/dL   RDW 19.0 (H) 11.5 - 15.5 %   Platelets 222 150 - 400 K/uL   Neutrophils Relative % 89 %   Lymphocytes Relative 4 %   Monocytes Relative 7 %   Eosinophils Relative 0 %   Basophils Relative 0 %   Neutro Abs 37.2 (H) 1.7 - 7.7 K/uL   Lymphs Abs 1.7 0.7 - 4.0 K/uL   Monocytes Absolute 2.9 (H) 0.1 - 1.0 K/uL   Eosinophils  Absolute 0.0 0.0 - 0.7 K/uL   Basophils Absolute 0.0 0.0 - 0.1 K/uL   WBC Morphology WHITE COUNT CONFIRMED ON SMEAR     Comment: Performed at [facility], [place] [address] [place], [place] [address]  Brain natriuretic peptide     Status: Abnormal   Collection Time: 03/01/2018  4:05 PM  Result Value Ref Range   B Natriuretic Peptide 456.1 (H) 0.0 - 100.0 pg/mL    Comment: Performed at [facility], [place] [address], [place], [place] [address]  Blood gas, arterial     Status: Abnormal   Collection Time: 03/05/2018  4:30 PM  Result Value Ref Range   O2 Content 6.0 L/min   Delivery systems NASAL CANNULA    pH, Arterial 7.343 (L) 7.350 - 7.450   pCO2 arterial 55.6 (H) 32.0 - 48.0 mmHg   pO2, Arterial 127 (H) 83.0 - 108.0 mmHg   Bicarbonate 29.4 (H) 20.0 - 28.0 mmol/L   Acid-Base Excess 3.7 (H) 0.0 - 2.0 mmol/L   O2 Saturation 98.4 %   Patient temperature 98.6    Collection site LEFT RADIAL    Drawn by [id number]    Sample type ARTERIAL DRAW    Allens test (pass/fail) PASS PASS    Comment: Performed at [facility] [address] [place], [place] [address]  POC occult blood, ED Provider will collect     Status: Abnormal   Collection Time: 02/20/2018  6:18 PM  Result Value Ref Range   Fecal Occult Bld POSITIVE (A) NEGATIVE  Urinalysis, Routine w reflex microscopic     Status: None   Collection Time: 02/11/2018  6:29 PM  Result Value Ref Range   Color, Urine YELLOW YELLOW   APPearance CLEAR CLEAR   Specific Gravity, Urine 1.012 1.005 - 1.030   pH 5.0 5.0 - 8.0   Glucose, UA NEGATIVE NEGATIVE mg/dL   Hgb urine dipstick NEGATIVE NEGATIVE   Bilirubin Urine NEGATIVE NEGATIVE   Ketones, ur NEGATIVE NEGATIVE mg/dL   Protein, ur NEGATIVE NEGATIVE mg/dL   Nitrite NEGATIVE NEGATIVE   Leukocytes, UA NEGATIVE NEGATIVE    Comment: Performed at [facility] [address] [place], [place] [address]  Type and screen [facility]  Status: None   Collection Time: 03/04/2018  7:28 PM  Result Value Ref Range   ABO/RH(D) O POS    Antibody Screen NEG    Sample Expiration      02/12/2018 Performed at [facility], [place] [address], [place], [place] [address]   ABO/Rh     Status: None   Collection Time: 02/11/2018  7:28 PM  Result Value Ref Range   ABO/RH(D)      O POS Performed at [facility], [place] [address] [place], [place] [address]   I-Stat CG4 Lactic Acid, ED     Status: Abnormal   Collection Time: 02/16/2018  8:43 PM  Result Value Ref Range   Lactic Acid, Venous 2.06 (HH) 0.5 - 1.9 mmol/L   Comment NOTIFIED PHYSICIAN    Dg Chest Portable 1 View  Result Date: 02/12/2018 CLINICAL DATA:  82 year old female with shortness of breath, volume overload, legs are weeping excess fluid. EXAM: PORTABLE CHEST 1 VIEW COMPARISON:  09/22/2017 and earlier. FINDINGS: Portable AP semi upright view at 1358 hours increased bilateral pleural effusion since December, moderate bilaterally and greater on the right. No pneumothorax. No pulmonary edema in the aerated upper lungs. Stable cardiac size and mediastinal contours. Moderate to severe underlying cardiomegaly. Stable visible upper abdomen including left upper quadrant coil pack. No acute osseous abnormality identified. IMPRESSION: Moderate bilateral pleural effusions. Underlying moderate to severe cardiomegaly. No acute pulmonary edema. Electronically Signed   By: Genevie Ann M.D.   On: 02/20/2018 14:40    Pending Labs Unresulted Labs (From admission, onward)   Start     Ordered   02/07/2018 1804  Blood culture (routine x 2)  BLOOD CULTURE X 2,   STAT     02/16/2018 1804   Signed and Held  Comprehensive metabolic panel  Tomorrow morning,   R     Signed and Held   Signed and Held  CBC  Now then every 6 hours,   R     Signed and Held      Vitals/Pain Today's Vitals   02/22/2018 1900 02/23/2018 2043 02/25/2018 2100 02/26/2018 2130  BP:  (!) 128/39 (!) 101/53 (!) 98/54 97/67  Pulse: 86 88 90 88  Resp: 20 19 (!) 21 (!) 22  Temp:      TempSrc:      SpO2: 100% 99% 99% 97%  PainSc:        Isolation Precautions No active isolations  Medications Medications  vancomycin (VANCOCIN) IVPB 1000 mg/200 mL premix (1,000 mg Intravenous New Bag/Given 02/12/2018 2116)  albuterol (PROVENTIL) (2.5 MG/3ML) 0.083% nebulizer solution 5 mg (5 mg Nebulization Given 02/23/2018 1504)  furosemide (LASIX) injection 60 mg (60 mg Intravenous Given 02/11/2018 1840)  ceFAZolin (ANCEF) IVPB 1 g/50 mL premix (0 g Intravenous Stopped 02/03/2018 2114)    Mobility walks with device

## 2018-02-09 NOTE — Telephone Encounter (Signed)
Addendum to last note: Pt's sister Curly Shores also stated she believed her sister was having stroke symptoms. At that time I advised her to visit the ED asap.

## 2018-02-09 NOTE — ED Notes (Signed)
Dr.Pickering notified that only an I stat Troponin was drawn from the Iv inserted. Unable to obtain blood from the IV.

## 2018-02-09 NOTE — ED Notes (Signed)
I gave I Stat CG4 critical result to MD [facility]

## 2018-02-09 NOTE — ED Provider Notes (Signed)
[place] DEPT Provider Note   CSN: [id number] Arrival date & time: 02/19/2018  1345     History   Chief Complaint Chief Complaint  Patient presents with  . Shortness of Breath    HPI Nicole Hutchinson is a 82 y.o. female.  HPI Patient presents with shortness of breath.  Has had for the last few days but has had episodes of dyspnea on exertion before this.  History of chronic edema of her lower legs.  Recently had 1 of her fluid pills changed to Lasix.  Over the last couple days more difficulty sleeping and more difficulty laying flat.  No cough.  No chest pain.  Has chronic edema on her lower legs.  Has had history of pleural effusions also in the past.  She is on Pradaxa for chronic atrial fibrillation.  Hypoxic for EMS with sats in the 80s. Past Medical History:  Diagnosis Date  . Atrial fibrillation ([place]) 2008   On Pradaxa  . Dyslipidemia   . HTN (hypertension)   . Hyperglycemia   . Hyperthyroidism   . Moderate mitral regurgitation by prior echocardiogram    Normal left ventricular function left atrial enlargement echo 2010  . Osteoarthritis   . Osteoporosis   . Stroke ([place])   . Tubulovillous adenoma of colon 2002    Patient Active Problem List   Diagnosis Date Noted  . Leg ulcer ([place]) 01/04/2018  . Back pain 04/20/2017  . Nodule of left lung 03/10/2017  . Chronic cough 11/06/2016  . Pleural effusion, right 11/06/2016  . History of colonic polyps 05/28/2015  . Wellness examination 05/15/2015  . Iron deficiency anemia 08/11/2014  . Thyroid mass 07/13/2013  . Atrial tachycardia   2:1 block 06/14/2013  . Encounter for long-term (current) use of other medications 07/01/2011  . Routine general medical examination at a health care facility 07/01/2011  . Junctional rhythm 05/07/2011  . Carotid stenosis 06/27/2010  . HEMORRHAGIC DISORDER DUE INTRINSIC CIRC ANTICOAG 02/06/2010  . Glaucoma 02/06/2010  . PERSONAL HISTORY OF COLONIC POLYPS  02/06/2010  . PERIPHERAL EDEMA 11/20/2009  . ATRIAL FIBRILLATION 08/15/2009  . MITRAL REGURGITATION 07/10/2009  . CVA 04/23/2009  . BRADYCARDIA 02/07/2009  . UNSPECIFIED DISORDER OF URETHRA&URINARY TRACT 09/14/2008  . HYPERCHOLESTEROLEMIA 02/10/2008  . HEARING LOSS 02/10/2008  . KNEE PAIN, RIGHT 02/10/2008  . Hyperglycemia 02/10/2008  . Essential hypertension 06/04/2007  . OSTEOARTHRITIS 06/04/2007  . Osteoporosis 06/04/2007    Past Surgical History:  Procedure Laterality Date  . ABDOMINAL HYSTERECTOMY    . COLON RESECTION  2006  . DEXA  12/29/2005  . right hemi-thyroidectomy    . stress cardiolite  09/20/2003     OB History   None      Home Medications    Prior to Admission medications   Medication Sig Start Date End Date Taking? Authorizing Provider  dabigatran (PRADAXA) 150 MG CAPS capsule Take 1 capsule (150 mg total) by mouth 2 (two) times daily. 01/08/18  Yes Renato Shin, MD  furosemide (LASIX) 20 MG tablet Take 1 tablet (20 mg total) by mouth daily. 01/04/18  Yes Renato Shin, MD  latanoprost (XALATAN) 0.005 % ophthalmic solution INSTILL ONE DROP INTO [facility] EYE AT BEDTIME 05/30/15  Yes Renato Shin, MD  loratadine (CLARITIN) 10 MG tablet Take 10 mg by mouth daily.   Yes [provider]  losartan (COZAAR) 50 MG tablet Take 1 tablet (50 mg total) by mouth daily. 01/04/18  Yes Renato Shin, MD  lovastatin (MEVACOR)  40 MG tablet Take 2 tablets (80 mg total) by mouth at bedtime. 01/08/18  Yes Renato Shin, MD  fluticasone (FLONASE) 50 MCG/ACT nasal spray USE 2 SPRAYS IN EACH NOSTRIL EVERY DAY Patient taking differently: Place 1 spray into both nostrils daily as needed for allergies.  01/07/18   Renato Shin, MD    Family History Family History  Problem Relation Age of Onset  . Lung cancer Paternal Uncle   . Pancreatic cancer Paternal Uncle   . Lung cancer Paternal Aunt     Social History Social History   Tobacco Use  . Smoking status: Never Smoker  .  Smokeless tobacco: Never Used  Substance Use Topics  . Alcohol use: No    Alcohol/week: 0.0 oz  . Drug use: No     Allergies   Ace inhibitors and Codeine   Review of Systems Review of Systems  Constitutional: Negative for appetite change and fever.  Respiratory: Positive for shortness of breath. Negative for cough.   Cardiovascular: Positive for leg swelling.  Gastrointestinal: Negative for abdominal pain.  Genitourinary: Negative for dysuria.  Musculoskeletal: Negative for back pain.  Skin: Negative for rash.  Neurological: Negative for syncope.  Hematological: Negative for adenopathy.     Physical Exam Updated Vital Signs BP (!) 138/46 (BP Location: Right Arm)   Pulse 84   Temp 97.6 F (36.4 C) (Oral)   Resp (!) 22   SpO2 100%   Physical Exam  Constitutional: She appears well-developed.  HENT:  Head: Normocephalic.  Neck: Neck supple.  Cardiovascular:   irregular rhythm  Pulmonary/Chest: Effort normal.  Rales bilateral bases.  Abdominal: There is no tenderness.  Musculoskeletal:       Right lower leg: She exhibits edema.       Left lower leg: She exhibits edema.  Moderate to severe edema bilateral lower extremities with some pressure dressings.  Neurological: She is alert.  Skin: Skin is warm. Capillary refill takes less than 2 seconds.  Psychiatric: She has a normal mood and affect.     ED Treatments / Results  Labs (all labs ordered are listed, but only abnormal results are displayed) Labs Reviewed  I-STAT TROPONIN, ED - Abnormal; Notable for the following components:      Result Value   Troponin i, poc 0.10 (*)    All other components within normal limits  COMPREHENSIVE METABOLIC PANEL  CBC WITH DIFFERENTIAL/PLATELET  URINALYSIS, ROUTINE W REFLEX MICROSCOPIC  BRAIN NATRIURETIC PEPTIDE  BLOOD GAS, ARTERIAL    EKG EKG Interpretation  Date/Time:  Tuesday Feb 09 2018 14:09:58 EDT Ventricular Rate:  79 PR Interval:    QRS Duration: 136 QT  Interval:  420 QTC Calculation: 482 R Axis:   109 Text Interpretation:  atrial fibrilation LAE, consider biatrial enlargement Right bundle branch block Confirmed by Davonna Belling [phone]) on 02/26/2018 2:34:18 PM   Radiology Dg Chest Portable 1 View  Result Date: 02/25/2018 CLINICAL DATA:  82 year old female with shortness of breath, volume overload, legs are weeping excess fluid. EXAM: PORTABLE CHEST 1 VIEW COMPARISON:  09/22/2017 and earlier. FINDINGS: Portable AP semi upright view at 1358 hours increased bilateral pleural effusion since December, moderate bilaterally and greater on the right. No pneumothorax. No pulmonary edema in the aerated upper lungs. Stable cardiac size and mediastinal contours. Moderate to severe underlying cardiomegaly. Stable visible upper abdomen including left upper quadrant coil pack. No acute osseous abnormality identified. IMPRESSION: Moderate bilateral pleural effusions. Underlying moderate to severe cardiomegaly. No acute pulmonary edema. Electronically  Signed   By: Genevie Ann M.D.   On: 02/20/2018 14:40    Procedures Procedures (including critical care time)  Medications Ordered in ED Medications  albuterol (PROVENTIL) (2.5 MG/3ML) 0.083% nebulizer solution 5 mg (5 mg Nebulization Given 02/06/2018 1504)     Initial Impression / Assessment and Plan / ED Course  I have reviewed the triage vital signs and the nursing notes.  Pertinent labs & imaging results that were available during my care of the patient were reviewed by me and considered in my medical decision making (see chart for details).     Patient with shortness of breath.  History of CHF and A. fib.  History of pleural effusions that she is seen Dr. Chase Caller for.  Initially hypoxic.  Moderate to severe edema on extremities.  Does have rales on bases however x-ray shows bilateral effusions and cardiomegaly.  Has had pericardial effusion in the past also.  Lab work difficult to obtain and I started IV  with ultrasound guidance.  Discussed with pulmonology for management of the effusions since she has seen them before patient will require admission to medicine.  Care will be turned over to Dr. Gilford Raid  Final Clinical Impressions(s) / ED Diagnoses   Final diagnoses:  Pleural effusion, bilateral  Hypoxia    ED Discharge Orders    None       Davonna Belling, MD 02/25/2018 1540

## 2018-02-09 NOTE — ED Notes (Signed)
[phone] Ulice Dash Sister

## 2018-02-09 NOTE — ED Provider Notes (Signed)
Pt signed out by Dr. Alvino Chapel pending labs.  Her hgb is only 8.2, last hgb 1 year ago was 12.  I did a rectal exam which showed brown stool.  Stool was guaiac +.  Type and screen ordered.  Pt is on pradaxa for a.fib.  Pt sees Dr. Henrene Pastor Velora Heckler) on a regular basis as she has a hx of colon cancer.  Pt d/w Dr. Fuller Plan (GI) who will put pt on the list to be seen tomorrow.  WBC is elevated to 41.8 and on rectal exam, I noted a large cellulitic area to right posterior thigh.  Tender to palpation.  I will start abx for cellulitis.  Blood cultures, lactic acid added.  UA ok.  BP has improved.  I have not ordered blood, but that may need to be given later.  Family updated and all questions answered.  CRITICAL CARE Performed by: Isla Pence   Total critical care time: 60 minutes  Critical care time was exclusive of separately billable procedures and treating other patients.  Critical care was necessary to treat or prevent imminent or life-threatening deterioration.  Critical care was time spent personally by me on the following activities: development of treatment plan with patient and/or surrogate as well as nursing, discussions with consultants, evaluation of patient's response to treatment, examination of patient, obtaining history from patient or surrogate, ordering and performing treatments and interventions, ordering and review of laboratory studies, ordering and review of radiographic studies, pulse oximetry and re-evaluation of patient's condition.  Pt d/w Dr. Jonelle Sidle ([facility]) for admission.     Isla Pence, MD 03/02/2018 2023

## 2018-02-09 NOTE — ED Notes (Signed)
Dr. Alvino Chapel notified of not being able to start an IV x 3 persons including the IV Team.

## 2018-02-09 NOTE — Telephone Encounter (Signed)
New Message   Pt sister is calling wanting to get an appointment with klein or a pa due to legs weeping and SOBm tried to schedule appt for June but states that's too far out, informed about going to the er but she refuses. Please call

## 2018-02-09 NOTE — ED Notes (Signed)
Tammy-Lab phlebotomist notified of need for a blood draw.

## 2018-02-09 NOTE — ED Notes (Signed)
Resp called to inform of ABG order.

## 2018-02-09 NOTE — H&P (Signed)
History and Physical    Nicole Hutchinson QJJ:[id number] DOB: 07-21-1936 DOA: 02/03/2018  Referring MD/NP/PA: Dr Gilford Raid  PCP: Renato Shin, MD   Outpatient Specialists: Dr Henrene Pastor, Teofilo Pod   Patient coming from: home  Chief Complaint: shortness of breath and confusion  HPI: Nicole Hutchinson is a 82 y.o. female with medical history significant of ung cancer with right-sided pleural effusion chronic lower extremity edema with wound who goes the wound care as well as chronic anticoagulation with Pradaxa secondary to atrial fibrillation who was brought by her 2 sisters to the ER with progressive weakness shortness of breath with exertion. Patient was not able to give adequate history in the beginning. She now mentions noticing an area in her right thigh that was swollen and red. Also has had fever at home. Patient denied any bright red blood per rectum but was found to have hemoglobin of 8.2 she also has significant cellulitis and leukocytosis. She still has her right pleural effusion which appears to be increasing. Due to her multitude of medical problems she is being admitted to the medical service.  ED Course: in the ER patient was found to have hypotension with systolic blood pressure initially in the 80s but currently stabilized above 100. White count is 42,000, hemoglobin 8.2 and it was 12 g just lie CL. Her UAs normal lactic acid is normal.She has a right thigh area of induration and cellulitis. Stool guaiac was positive. GI was consult at an [facility] was consulted regarding pleural effusion as seen on chest x-ray.  Review of Systems: As per HPI otherwise 10 point review of systems negative.    Past Medical History:  Diagnosis Date  . Atrial fibrillation ([place]) 2008   On Pradaxa  . Dyslipidemia   . HTN (hypertension)   . Hyperglycemia   . Hyperthyroidism   . Moderate mitral regurgitation by prior echocardiogram    Normal left ventricular function left atrial enlargement echo  2010  . Osteoarthritis   . Osteoporosis   . Stroke ([place])   . Tubulovillous adenoma of colon 2002    Past Surgical History:  Procedure Laterality Date  . ABDOMINAL HYSTERECTOMY    . COLON RESECTION  2006  . DEXA  12/29/2005  . right hemi-thyroidectomy    . stress cardiolite  09/20/2003     reports that she has never smoked. She has never used smokeless tobacco. She reports that she does not drink alcohol or use drugs.  Allergies  Allergen Reactions  . Ace Inhibitors     REACTION: cough  . Codeine     Family History  Problem Relation Age of Onset  . Lung cancer Paternal Uncle   . Pancreatic cancer Paternal Uncle   . Lung cancer Paternal Aunt     Prior to Admission medications   Medication Sig Start Date End Date Taking? Authorizing Provider  dabigatran (PRADAXA) 150 MG CAPS capsule Take 1 capsule (150 mg total) by mouth 2 (two) times daily. 01/08/18  Yes Renato Shin, MD  furosemide (LASIX) 20 MG tablet Take 1 tablet (20 mg total) by mouth daily. 01/04/18  Yes Renato Shin, MD  latanoprost (XALATAN) 0.005 % ophthalmic solution INSTILL ONE DROP INTO [facility] EYE AT BEDTIME 05/30/15  Yes Renato Shin, MD  loratadine (CLARITIN) 10 MG tablet Take 10 mg by mouth daily.   Yes [provider]  losartan (COZAAR) 50 MG tablet Take 1 tablet (50 mg total) by mouth daily. 01/04/18  Yes Renato Shin, MD  lovastatin (MEVACOR)  40 MG tablet Take 2 tablets (80 mg total) by mouth at bedtime. 01/08/18  Yes Renato Shin, MD  fluticasone (FLONASE) 50 MCG/ACT nasal spray USE 2 SPRAYS IN EACH NOSTRIL EVERY DAY Patient taking differently: Place 1 spray into both nostrils daily as needed for allergies.  01/07/18   Renato Shin, MD    Physical Exam: Vitals:   02/07/2018 1900 02/22/2018 2043 03/05/2018 2100 02/12/2018 2130  BP: (!) 128/39 (!) 101/53 (!) 98/54 97/67  Pulse: 86 88 90 88  Resp: 20 19 (!) 21 (!) 22  Temp:      TempSrc:      SpO2: 100% 99% 99% 97%      Constitutional: NAD, calm,  comfortable Vitals:   02/07/2018 1900 02/03/2018 2043 02/08/2018 2100 02/16/2018 2130  BP: (!) 128/39 (!) 101/53 (!) 98/54 97/67  Pulse: 86 88 90 88  Resp: 20 19 (!) 21 (!) 22  Temp:      TempSrc:      SpO2: 100% 99% 99% 97%   Eyes: PERRL, lids and conjunctivae normal ENMT: Mucous membranes are moist. Posterior pharynx clear of any exudate or lesions.Normal dentition.  Neck: normal, supple, no masses, no thyromegaly Respiratory: Decreased AE with basal crackles Cardiovascular: Regular rate and rhythm, no murmurs / rubs / gallops. No extremity edema. 2+ pedal pulses. No carotid bruits.  Abdomen: no tenderness, no masses palpated. No hepatosplenomegaly. Bowel sounds positive.  Musculoskeletal: no clubbing / cyanosis. No joint deformity upper and lower extremities. Good ROM, no contractures. Normal muscle tone.  Skin: right medial thigh induration, red tender to touch and warm. Overall skin is pale Neurologic: CN 2-12 grossly intact. Sensation intact, DTR normal. Strength 5/5 in all 4.  Psychiatric: Normal judgment and insight. Alert and oriented x 3. Normal mood.    Labs on Admission: I have personally reviewed following labs and imaging studies  CBC: Recent Labs  Lab 02/07/2018 1605  WBC 41.8*  NEUTROABS 37.2*  HGB 8.2*  HCT 28.1*  MCV 65.7*  PLT 606   Basic Metabolic Panel: Recent Labs  Lab 02/24/2018 1605  NA 134*  K 4.1  CL 95*  CO2 27  GLUCOSE 126*  BUN 23*  CREATININE 1.01*  CALCIUM 8.5*   GFR: CrCl cannot be calculated (Unknown ideal weight.). Liver Function Tests: Recent Labs  Lab 02/28/2018 1605  AST 19  ALT 15  ALKPHOS 78  BILITOT 0.5  PROT 5.9*  ALBUMIN 2.9*   No results for input(s): LIPASE, AMYLASE in the last 168 hours. No results for input(s): AMMONIA in the last 168 hours. Coagulation Profile: No results for input(s): INR, PROTIME in the last 168 hours. Cardiac Enzymes: No results for input(s): CKTOTAL, CKMB, CKMBINDEX, TROPONINI in the last 168  hours. BNP (last 3 results) Recent Labs    01/04/18 1214  PROBNP 324.0*   HbA1C: No results for input(s): HGBA1C in the last 72 hours. CBG: No results for input(s): GLUCAP in the last 168 hours. Lipid Profile: No results for input(s): CHOL, HDL, LDLCALC, TRIG, CHOLHDL, LDLDIRECT in the last 72 hours. Thyroid Function Tests: No results for input(s): TSH, T4TOTAL, FREET4, T3FREE, THYROIDAB in the last 72 hours. Anemia Panel: No results for input(s): VITAMINB12, FOLATE, FERRITIN, TIBC, IRON, RETICCTPCT in the last 72 hours. Urine analysis:    Component Value Date/Time   COLORURINE YELLOW 02/04/2018 1829   APPEARANCEUR CLEAR 02/13/2018 1829   LABSPEC 1.012 02/08/2018 1829   PHURINE 5.0 02/20/2018 1829   GLUCOSEU NEGATIVE 02/22/2018 1829   GLUCOSEU  NEGATIVE 01/04/2018 [place] 02/27/2018 1829   HGBUR small 09/14/2008 [place] 03/01/2018 1829   BILIRUBINUR Negative 04/20/2017 [place] 02/14/2018 1829   PROTEINUR NEGATIVE 02/16/2018 1829   UROBILINOGEN 0.2 01/04/2018 1214   NITRITE NEGATIVE 02/17/2018 1829   LEUKOCYTESUR NEGATIVE 02/07/2018 1829   Sepsis Labs: @LABRCNTIP (procalcitonin:4,lacticidven:4) )No results found for this or any previous visit (from the past 240 hour(s)).   Radiological Exams on Admission: Dg Chest Portable 1 View  Result Date: 02/20/2018 CLINICAL DATA:  82 year old female with shortness of breath, volume overload, legs are weeping excess fluid. EXAM: PORTABLE CHEST 1 VIEW COMPARISON:  09/22/2017 and earlier. FINDINGS: Portable AP semi upright view at 1358 hours increased bilateral pleural effusion since December, moderate bilaterally and greater on the right. No pneumothorax. No pulmonary edema in the aerated upper lungs. Stable cardiac size and mediastinal contours. Moderate to severe underlying cardiomegaly. Stable visible upper abdomen including left upper quadrant coil pack. No acute osseous abnormality  identified. IMPRESSION: Moderate bilateral pleural effusions. Underlying moderate to severe cardiomegaly. No acute pulmonary edema. Electronically Signed   By: Genevie Ann M.D.   On: 02/22/2018 14:40    EKG: Independently reviewed.   Assessment/Plan Active Problems:   Essential hypertension   ATRIAL FIBRILLATION   PERIPHERAL EDEMA   Iron deficiency anemia   Pleural effusion, right   Leucocytosis   GI bleed   SIRS (systemic inflammatory response syndrome) (HCC)   #1. Shortness of breath: This is multifactorial. The pleural effusion plus symptomatic anemia from a GI bleed. Possibly also congestive heart failure which is diastolic.We'll address and the line problems. Place patient on oxygen. Monitor patient on telemetry.  #2 GI bleed: Secondary to anticoagulation in a patient with previous history of colon cancer. GI has been consult. We will type and crossmatch for 2 units of packed red blood cells and transfuse. IV Pepcid. Monitor H&H. Hold anticoagulation.  #3 atrial fibrillation: Rate is controlled. We will hold anticoagulation for now  #4 right-sided pleural effusion: [facility] and consulted. Defer decision on thoracentesis to them.  #5 anemia of acute blood loss: Patient will be transfused as indicated above. Monitor H&H.  #6 cellulitis: Patient has cellulitis of the right thigh. We will initiate vancomycin and Zosyn. Follow patient closely. Blood cultures obtained. May consult wound care in the morning   DVT prophylaxis: SCD  Code Status: Full  Family Communication: sisters at bedside  Disposition Plan: to be determined  Consults called: gastroenterology Dr. Fuller Plan, PCCM  Admission status: in patient to telemetry  Severity of Illness: The appropriate patient status for this patient is INPATIENT. Inpatient status is judged to be reasonable and necessary in order to provide the required intensity of service to ensure the patient's safety. The patient's presenting symptoms, physical exam  findings, and initial radiographic and laboratory data in the context of their chronic comorbidities is felt to place them at high risk for further clinical deterioration. Furthermore, it is not anticipated that the patient will be medically stable for discharge from the hospital within 2 midnights of admission. The following factors support the patient status of inpatient.   " The patient's presenting symptoms include shortness of breath and altered mental status. " The worrisome physical exam findings include pale nasal and with guaiac positive stools. " The initial radiographic and laboratory data are worrisome because of hemoglobin 8.2. " The chronic co-morbidities include history of colon cancer and atrial fibrillation.   * I certify that  at the point of admission it is my clinical judgment that the patient will require inpatient hospital care spanning beyond 2 midnights from the point of admission due to high intensity of service, high risk for further deterioration and high frequency of surveillance required.Barbette Merino MD [facility] Pager [phone]  If 7PM-7AM, please contact night-coverage [email or web address] Password [facility]  02/26/2018, 9:56 PM

## 2018-02-09 NOTE — ED Notes (Signed)
Lab called for Lab stick.

## 2018-02-09 NOTE — ED Triage Notes (Signed)
Family states that patient had her BP med changed to one without the diuretic and put o lasix to try to pull out excess fluid. Pt had PT come out last Wed and did good. But family reports that since last Wed been SOB esp with exertion. Unsure if patient has CHF or not.

## 2018-02-09 NOTE — Telephone Encounter (Signed)
Spoke with pt's sister, Nicole Hutchinson. Nicole Hutchinson was quite frustrated over Dr Olin Pia availability. I actually spoke with pts sister, Nicole Hutchinson, yesterday where I explained there was limited availability and her sister's appointment on 6/5 was going to be the soonest for a routine OV. Pt's sister stated "She will only see Dr Caryl Comes." At this time, Nicole Hutchinson became loud and interrupted numerous times while I attempted to speak with her regarding her sister's situation. Pt has been seen recently by her Internal Medicine physician who prescribed her lasix and home health for wound care. Both of pt's sister's stated Nicole Hutchinson has not gained any weight recently but intermittently is SOB. I stated to Nicole Hutchinson based off her sisters current symptoms, negative BLE doppler studies, and last Echo her symptoms may not be cardiac related. I again, advised them to seek advice from her pulmonologist and internal medicine physicians. Pt has recent hx of pulmonary effusion (Dec 2018). Nicole Hutchinson became very upset and loud stating she was upset and disappointed Dr Caryl Comes could not see her based off limited availability. I apologized numerous times and advised her to call her pulmonologist office or her internal medicine physician.   I also called pt's Internal Medicine physician's RN and explained the situation. She stated she would message Dr Loanne Drilling to see if he would like for her to come in for an earlier OV.

## 2018-02-10 ENCOUNTER — Other Ambulatory Visit: Payer: Self-pay

## 2018-02-10 ENCOUNTER — Other Ambulatory Visit (HOSPITAL_COMMUNITY): Payer: Medicare PPO

## 2018-02-10 ENCOUNTER — Inpatient Hospital Stay (HOSPITAL_COMMUNITY): Payer: Medicare PPO

## 2018-02-10 ENCOUNTER — Inpatient Hospital Stay: Payer: Self-pay

## 2018-02-10 DIAGNOSIS — D500 Iron deficiency anemia secondary to blood loss (chronic): Secondary | ICD-10-CM

## 2018-02-10 DIAGNOSIS — J9601 Acute respiratory failure with hypoxia: Secondary | ICD-10-CM

## 2018-02-10 DIAGNOSIS — L03115 Cellulitis of right lower limb: Secondary | ICD-10-CM

## 2018-02-10 DIAGNOSIS — A419 Sepsis, unspecified organism: Principal | ICD-10-CM

## 2018-02-10 DIAGNOSIS — D62 Acute posthemorrhagic anemia: Secondary | ICD-10-CM

## 2018-02-10 DIAGNOSIS — D72829 Elevated white blood cell count, unspecified: Secondary | ICD-10-CM

## 2018-02-10 DIAGNOSIS — J90 Pleural effusion, not elsewhere classified: Secondary | ICD-10-CM

## 2018-02-10 DIAGNOSIS — R195 Other fecal abnormalities: Secondary | ICD-10-CM

## 2018-02-10 DIAGNOSIS — R6521 Severe sepsis with septic shock: Secondary | ICD-10-CM

## 2018-02-10 DIAGNOSIS — R748 Abnormal levels of other serum enzymes: Secondary | ICD-10-CM

## 2018-02-10 DIAGNOSIS — R6510 Systemic inflammatory response syndrome (SIRS) of non-infectious origin without acute organ dysfunction: Secondary | ICD-10-CM

## 2018-02-10 DIAGNOSIS — I361 Nonrheumatic tricuspid (valve) insufficiency: Secondary | ICD-10-CM

## 2018-02-10 LAB — BLOOD GAS, ARTERIAL
ACID-BASE EXCESS: 3 mmol/L — AB (ref 0.0–2.0)
Acid-Base Excess: 2 mmol/L (ref 0.0–2.0)
Bicarbonate: 28.7 mmol/L — ABNORMAL HIGH (ref 20.0–28.0)
Bicarbonate: 28.1 mmol/L — ABNORMAL HIGH (ref 20.0–28.0)
DELIVERY SYSTEMS: POSITIVE
DRAWN BY: 270211
Drawn by: 225631
EXPIRATORY PAP: 5
FIO2: 0.4
Inspiratory PAP: 16
Mode: POSITIVE
O2 Content: 6 L/min
O2 Saturation: 92.1 %
O2 Saturation: 93 %
PATIENT TEMPERATURE: 99.4
PCO2 ART: 56.8 mmHg — AB (ref 32.0–48.0)
PH ART: 7.326 — AB (ref 7.350–7.450)
PO2 ART: 71.8 mmHg — AB (ref 83.0–108.0)
Patient temperature: 98.6
RATE: 24 resp/min
pCO2 arterial: 57.9 mmHg — ABNORMAL HIGH (ref 32.0–48.0)
pH, Arterial: 7.307 — ABNORMAL LOW (ref 7.350–7.450)
pO2, Arterial: 72.6 mmHg — ABNORMAL LOW (ref 83.0–108.0)

## 2018-02-10 LAB — GLUCOSE, CAPILLARY
GLUCOSE-CAPILLARY: 71 mg/dL (ref 65–99)
Glucose-Capillary: 85 mg/dL (ref 65–99)

## 2018-02-10 LAB — CBC
HCT: 27.5 % — ABNORMAL LOW (ref 36.0–46.0)
HCT: 27.7 % — ABNORMAL LOW (ref 36.0–46.0)
HCT: 33.8 % — ABNORMAL LOW (ref 36.0–46.0)
Hemoglobin: 8 g/dL — ABNORMAL LOW (ref 12.0–15.0)
Hemoglobin: 7.8 g/dL — ABNORMAL LOW (ref 12.0–15.0)
Hemoglobin: 10 g/dL — ABNORMAL LOW (ref 12.0–15.0)
MCH: 18.9 pg — ABNORMAL LOW (ref 26.0–34.0)
MCH: 18.8 pg — AB (ref 26.0–34.0)
MCH: 21.2 pg — ABNORMAL LOW (ref 26.0–34.0)
MCHC: 29.1 g/dL — ABNORMAL LOW (ref 30.0–36.0)
MCHC: 28.2 g/dL — ABNORMAL LOW (ref 30.0–36.0)
MCHC: 29.6 g/dL — AB (ref 30.0–36.0)
MCV: 65 fL — AB (ref 78.0–100.0)
MCV: 66.7 fL — ABNORMAL LOW (ref 78.0–100.0)
MCV: 71.6 fL — AB (ref 78.0–100.0)
PLATELETS: 200 10*3/uL (ref 150–400)
PLATELETS: 175 10*3/uL (ref 150–400)
Platelets: 202 10*3/uL (ref 150–400)
RBC: 4.23 MIL/uL (ref 3.87–5.11)
RBC: 4.15 MIL/uL (ref 3.87–5.11)
RBC: 4.72 MIL/uL (ref 3.87–5.11)
RDW: 19.1 % — AB (ref 11.5–15.5)
RDW: 19.1 % — ABNORMAL HIGH (ref 11.5–15.5)
RDW: 23.4 % — AB (ref 11.5–15.5)
WBC: 39.3 10*3/uL — AB (ref 4.0–10.5)
WBC: 56.2 10*3/uL (ref 4.0–10.5)
WBC: 48.2 10*3/uL — AB (ref 4.0–10.5)

## 2018-02-10 LAB — COMPREHENSIVE METABOLIC PANEL
ALT: 14 U/L (ref 14–54)
AST: 21 U/L (ref 15–41)
Albumin: 2.5 g/dL — ABNORMAL LOW (ref 3.5–5.0)
Alkaline Phosphatase: 69 U/L (ref 38–126)
Anion gap: 11 (ref 5–15)
BILIRUBIN TOTAL: 0.7 mg/dL (ref 0.3–1.2)
BUN: 24 mg/dL — AB (ref 6–20)
CO2: 27 mmol/L (ref 22–32)
CREATININE: 1.07 mg/dL — AB (ref 0.44–1.00)
Calcium: 8.2 mg/dL — ABNORMAL LOW (ref 8.9–10.3)
Chloride: 94 mmol/L — ABNORMAL LOW (ref 101–111)
GFR calc Af Amer: 55 mL/min — ABNORMAL LOW (ref >60)
GFR, EST NON AFRICAN AMERICAN: 48 mL/min — AB (ref >60)
Glucose, Bld: 83 mg/dL (ref 65–99)
Potassium: 4.3 mmol/L (ref 3.5–5.1)
Sodium: 132 mmol/L — ABNORMAL LOW (ref 135–145)
TOTAL PROTEIN: 5.1 g/dL — AB (ref 6.5–8.1)

## 2018-02-10 LAB — BASIC METABOLIC PANEL
Anion gap: 10 (ref 5–15)
BUN: 29 mg/dL — AB (ref 6–20)
CHLORIDE: 98 mmol/L — AB (ref 101–111)
CO2: 25 mmol/L (ref 22–32)
CREATININE: 1.39 mg/dL — AB (ref 0.44–1.00)
Calcium: 7.5 mg/dL — ABNORMAL LOW (ref 8.9–10.3)
GFR calc Af Amer: 40 mL/min — ABNORMAL LOW (ref >60)
GFR calc non Af Amer: 35 mL/min — ABNORMAL LOW (ref >60)
Glucose, Bld: 100 mg/dL — ABNORMAL HIGH (ref 65–99)
Potassium: 4.3 mmol/L (ref 3.5–5.1)
SODIUM: 133 mmol/L — AB (ref 135–145)

## 2018-02-10 LAB — MRSA PCR SCREENING: MRSA by PCR: NEGATIVE

## 2018-02-10 LAB — TSH: TSH: 1.579 u[IU]/mL (ref 0.350–4.500)

## 2018-02-10 LAB — TROPONIN I
TROPONIN I: 0.8 ng/mL — AB (ref <0.03)
Troponin I: 0.12 ng/mL (ref <0.03)
Troponin I: 0.38 ng/mL (ref <0.03)

## 2018-02-10 LAB — ECHOCARDIOGRAM COMPLETE
Height: 54 in
WEIGHTICAEL: 2045.87 [oz_av]

## 2018-02-10 LAB — CORTISOL: Cortisol, Plasma: 62.1 ug/dL

## 2018-02-10 LAB — LACTIC ACID, PLASMA: LACTIC ACID, VENOUS: 2.2 mmol/L — AB (ref 0.5–1.9)

## 2018-02-10 LAB — PREPARE RBC (CROSSMATCH)

## 2018-02-10 MED ORDER — SODIUM CHLORIDE 0.9% FLUSH
10.0000 mL | Freq: Two times a day (BID) | INTRAVENOUS | Status: DC
  Administered 2018-02-10 – 2018-02-11 (×3): 10 mL
  Administered 2018-02-11: 20 mL
  Administered 2018-02-12 – 2018-02-13 (×3): 10 mL
  Administered 2018-02-13: 20 mL
  Administered 2018-02-14: 40 mL
  Administered 2018-02-14: 10 mL

## 2018-02-10 MED ORDER — SODIUM CHLORIDE 0.9 % IV SOLN
0.0000 ug/min | INTRAVENOUS | Status: DC
  Administered 2018-02-10: 250 ug/min via INTRAVENOUS
  Filled 2018-02-10 (×3): qty 4

## 2018-02-10 MED ORDER — LOSARTAN POTASSIUM 50 MG PO TABS
50.0000 mg | ORAL_TABLET | Freq: Every day | ORAL | Status: DC
  Filled 2018-02-10: qty 1

## 2018-02-10 MED ORDER — SODIUM CHLORIDE 0.9 % IV SOLN
INTRAVENOUS | Status: DC
Start: 2018-02-10 — End: 2018-02-15
  Administered 2018-02-10 – 2018-02-12 (×3): via INTRAVENOUS

## 2018-02-10 MED ORDER — SODIUM CHLORIDE 0.9 % IV SOLN: Freq: Once | INTRAVENOUS | Status: DC

## 2018-02-10 MED ORDER — ACETAMINOPHEN 325 MG PO TABS: 650.0000 mg | ORAL_TABLET | Freq: Once | ORAL | Status: DC

## 2018-02-10 MED ORDER — SODIUM CHLORIDE 0.9% FLUSH
10.0000 mL | INTRAVENOUS | Status: DC | PRN
  Administered 2018-02-12: 10 mL
  Filled 2018-02-10: qty 40

## 2018-02-10 MED ORDER — PHENYLEPHRINE HCL-NACL 10-0.9 MG/250ML-% IV SOLN
0.0000 ug/min | INTRAVENOUS | Status: DC
  Administered 2018-02-10: 20 ug/min via INTRAVENOUS
  Administered 2018-02-10 (×2): 100 ug/min via INTRAVENOUS
  Filled 2018-02-10 (×5): qty 250

## 2018-02-10 MED ORDER — FUROSEMIDE 20 MG PO TABS: 20.0000 mg | ORAL_TABLET | Freq: Every day | ORAL | Status: DC

## 2018-02-10 MED ORDER — PIPERACILLIN-TAZOBACTAM 3.375 G IVPB
3.3750 g | Freq: Three times a day (TID) | INTRAVENOUS | Status: DC
  Administered 2018-02-10 – 2018-02-11 (×3): 3.375 g via INTRAVENOUS
  Filled 2018-02-10 (×3): qty 50

## 2018-02-10 MED ORDER — COLLAGENASE 250 UNIT/GM EX OINT
TOPICAL_OINTMENT | Freq: Every day | CUTANEOUS | Status: DC
  Administered 2018-02-10: 22:00:00 via TOPICAL
  Administered 2018-02-11 – 2018-02-12 (×2): 1 via TOPICAL
  Administered 2018-02-13: 10:00:00 via TOPICAL
  Administered 2018-02-14: 1 via TOPICAL
  Filled 2018-02-10: qty 90

## 2018-02-10 MED ORDER — FAMOTIDINE IN NACL 20-0.9 MG/50ML-% IV SOLN
20.0000 mg | Freq: Two times a day (BID) | INTRAVENOUS | Status: DC
  Administered 2018-02-10 (×3): 20 mg via INTRAVENOUS
  Filled 2018-02-10 (×3): qty 50

## 2018-02-10 MED ORDER — LATANOPROST 0.005 % OP SOLN
1.0000 [drp] | Freq: Every day | OPHTHALMIC | Status: DC
  Administered 2018-02-10 – 2018-02-14 (×6): 1 [drp] via OPHTHALMIC
  Filled 2018-02-10 (×2): qty 2.5

## 2018-02-10 MED ORDER — PRAVASTATIN SODIUM 20 MG PO TABS: 80.0000 mg | ORAL_TABLET | Freq: Every day | ORAL | Status: DC

## 2018-02-10 MED ORDER — FLUTICASONE PROPIONATE 50 MCG/ACT NA SUSP
1.0000 | Freq: Every day | NASAL | Status: DC | PRN
  Filled 2018-02-10: qty 16

## 2018-02-10 MED ORDER — LORATADINE 10 MG PO TABS: 10.0000 mg | ORAL_TABLET | Freq: Every day | ORAL | Status: DC

## 2018-02-10 MED ORDER — DABIGATRAN ETEXILATE MESYLATE 150 MG PO CAPS
150.0000 mg | ORAL_CAPSULE | Freq: Two times a day (BID) | ORAL | Status: DC
  Administered 2018-02-10: 150 mg via ORAL
  Filled 2018-02-10: qty 1

## 2018-02-10 MED ORDER — HYDROCORTISONE NA SUCCINATE PF 100 MG IJ SOLR
50.0000 mg | Freq: Four times a day (QID) | INTRAMUSCULAR | Status: DC
  Administered 2018-02-10 – 2018-02-15 (×19): 50 mg via INTRAVENOUS
  Filled 2018-02-10 (×19): qty 2

## 2018-02-10 MED ORDER — NOREPINEPHRINE BITARTRATE 1 MG/ML IV SOLN
0.0000 ug/min | INTRAVENOUS | Status: DC
  Administered 2018-02-10: 2 ug/min via INTRAVENOUS
  Administered 2018-02-11: 3 ug/min via INTRAVENOUS
  Administered 2018-02-11: 7 ug/min via INTRAVENOUS
  Administered 2018-02-12 (×2): 24 ug/min via INTRAVENOUS
  Filled 2018-02-10 (×7): qty 4

## 2018-02-10 MED ORDER — SODIUM CHLORIDE 0.9 % IV BOLUS
500.0000 mL | Freq: Once | INTRAVENOUS | Status: AC
  Administered 2018-02-10: 500 mL via INTRAVENOUS

## 2018-02-10 MED ORDER — DIPHENHYDRAMINE HCL 50 MG/ML IJ SOLN: 25.0000 mg | Freq: Once | INTRAMUSCULAR | Status: DC

## 2018-02-10 MED ORDER — VANCOMYCIN HCL 500 MG IV SOLR: 500.0000 mg | INTRAVENOUS | Status: DC

## 2018-02-10 NOTE — Progress Notes (Signed)
During AM report RN was informed that 1 unit of PRBC was ready, but was not to be given at this time as a 500 cc NS bolus was just given for hypotension. Night shift RN Elwin Mocha stated this instruction was given to her by Dr. Oletta Darter. Will f/u w/ attending during AM rounds.

## 2018-02-10 NOTE — Progress Notes (Signed)
Pharmacy Antibiotic Note  BLIMI GODBY is a 82 y.o. female admitted on 02/08/2018 with cellulitis.  Pharmacy has been consulted for Vancomycin and Zosyn dosing.  Plan: Zosyn 3.375g IV q8h (4 hour infusion).  Vancomycin 1gm iv x1, then 500mg  iv q48hr Goal AUC = 400 - 500 for all indications, except meningitis (goal AUC > 500 and Cmin 15-20 mcg/mL)   Height: 4\' 6"  (137.2 cm) Weight: 127 lb 13.9 oz (58 kg) IBW/kg (Calculated) : 31.7  Temp (24hrs), Avg:98.4 F (36.9 C), Min:97.6 F (36.4 C), Max:99.3 F (37.4 C)  Recent Labs  Lab 03/05/2018 1605 02/05/2018 2043 02/10/18 0200  WBC 41.8*  --  39.3*  CREATININE 1.01*  --  1.07*  LATICACIDVEN  --  2.06*  --     Estimated Creatinine Clearance: 27.9 mL/min (A) (by C-G formula based on SCr of 1.07 mg/dL (H)).    Allergies  Allergen Reactions  . Ace Inhibitors     REACTION: cough  . Codeine     Antimicrobials this admission: Vancomycin 02/03/2018 >> Zosyn 02/10/2018 >>   Dose adjustments this admission: -  Microbiology results: -  Thank you for allowing pharmacy to be a part of this patient's care.  Nani Skillern Crowford 02/10/2018 4:42 AM

## 2018-02-10 NOTE — Progress Notes (Signed)
Patient ID: Nicole Hutchinson, female   DOB: 1935/10/30, 82 y.o.   MRN: [id number] .jkim    Cc: hypotension HPI: 82 yo female with Pafib, admitted for sob, confusion and crackles. Found to have right medial thigh cellulitis.  RN called due to low bp, hypotension  sbp 62. Pt was apparently given ? Ns 567mL with improvement in bp  Heent: anicteric Neck: ? jvd Heart: rrr s1, s2 Lung: crackles in bilateral lung bases.  No wheezing Abd: soft, obese Ext: no c/c, distal lower ext wrapped Skin: redness of the right medial thigh  A/P Hypotension Transfer to STEPDOWN Tele 12 lead ekg Trop I q6hx3 Cortisol level Cardiac echo  Critical care time 30 minutes

## 2018-02-10 NOTE — Progress Notes (Signed)
No urine output documented since admission. Pt has purewick in place. Approximately 5-10 cc of urine in canister. Will bladder scan; Waiting for portable equipment to obtain and bring scanner to unit. Will continue to monitor pt closely.

## 2018-02-10 NOTE — Progress Notes (Addendum)
eLink Physician-Brief Progress Note Patient Name: Nicole Hutchinson DOB: 28-Feb-1936 MRN: [id number]   Date of Service  02/10/2018  HPI/Events of Note  Moved to ICU this morning d/t hypotension and ALOC. BP = 73/31 with MAP = 43. LVEF = 60% to 65% on 12/29/2016. Recent CXR with Cardiomegally and no pulmonary edema.   eICU Interventions  Will order: 1. Phenylephrine IV infusion. Titrate to MAP > 65. 2. Bolus with 0.9 NaCl 500 mL IV over 31 minutes now.      Intervention Category Major Interventions: Hypotension - evaluation and management  Kyonna Frier [place] 02/10/2018, 6:33 AM

## 2018-02-10 NOTE — Consult Note (Addendum)
PULMONARY / CRITICAL CARE MEDICINE   Name: Nicole Hutchinson MRN: [id number] DOB: 1936-10-06    ADMISSION DATE:  02/22/2018 CONSULTATION DATE:  02/10/18  REFERRING MD:  Dr. Karleen Hampshire   CHIEF COMPLAINT:  SOB, LE Swelling  HISTORY OF PRESENT ILLNESS:   82 y/o F, never smoker, who presented to [facility] on 5/7 with complaints of shortness of breath.   The patient's sister called her Cardiologist on 5/6 with c/o SOB and LE swelling.  She recently was started on lasix by her PCP.  She has chronic LE wounds and is followed by wound care.  Per notes, recently had BLE dopplers that were negative. Acute symptoms began around May 1st. She lives with her sister who reported she was more sleepy and had difficulty lying flat.  Her pastor noted she had been sleeping during church for the last two months.  She is on pradaxa for AF.  Sister called EMS 5/7 for increasing SOB.  EMS found her to be hypoxic with sats in the 80's on arrival.  She was admitted per [facility] to further evaluate hypoxic respiratory failure.  Work up notable for large right effusion on CXR, leukocytosis, FOBT positive and right thigh cellulitis.  UA negative.  Cultures pending.  The patient developed hypotension overnight on 5/8 and was transferred to ICU.  She required vasopressors for MAP.  She had altered mental status.  ABG 7.326 / 56.8 / 71 / 28.    PCCM consulted for pleural effusion and hypotension.    PAST MEDICAL HISTORY :  She  has a past medical history of Atrial fibrillation ([place]) (2008), Dyslipidemia, HTN (hypertension), Hyperglycemia, Hyperthyroidism, Moderate mitral regurgitation by prior echocardiogram, Osteoarthritis, Osteoporosis, Stroke ([place]), and Tubulovillous adenoma of colon (2002).  PAST SURGICAL HISTORY: She  has a past surgical history that includes Abdominal hysterectomy; right hemi-thyroidectomy; stress cardiolite (09/20/2003); DEXA (12/29/2005); and Colon resection (2006).  Allergies  Allergen Reactions  . Ace Inhibitors      REACTION: cough  . Codeine     No current facility-administered medications on file prior to encounter.    Current Outpatient Medications on File Prior to Encounter  Medication Sig  . dabigatran (PRADAXA) 150 MG CAPS capsule Take 1 capsule (150 mg total) by mouth 2 (two) times daily.  . furosemide (LASIX) 20 MG tablet Take 1 tablet (20 mg total) by mouth daily.  Marland Kitchen latanoprost (XALATAN) 0.005 % ophthalmic solution INSTILL ONE DROP INTO EACH EYE AT BEDTIME  . loratadine (CLARITIN) 10 MG tablet Take 10 mg by mouth daily.  Marland Kitchen losartan (COZAAR) 50 MG tablet Take 1 tablet (50 mg total) by mouth daily.  Marland Kitchen lovastatin (MEVACOR) 40 MG tablet Take 2 tablets (80 mg total) by mouth at bedtime.  . fluticasone (FLONASE) 50 MCG/ACT nasal spray USE 2 SPRAYS IN EACH NOSTRIL EVERY DAY (Patient taking differently: Place 1 spray into both nostrils daily as needed for allergies. )    FAMILY HISTORY:  Her indicated that her mother is deceased. She indicated that her father is deceased. She indicated that her maternal grandmother is deceased. She indicated that her maternal grandfather is deceased. She indicated that her paternal grandmother is deceased. She indicated that her paternal grandfather is deceased. She indicated that the status of her paternal aunt is unknown.   SOCIAL HISTORY: She  reports that she has never smoked. She has never used smokeless tobacco. She reports that she does not drink alcohol or use drugs.  REVIEW OF SYSTEMS:  Unable to complete as  patient is on bipap.   SUBJECTIVE:    VITAL SIGNS: BP (!) 111/52   Pulse 97   Temp 98.9 F (37.2 C) (Axillary)   Resp (!) 21   Ht 4\' 6"  (1.372 m)   Wt 127 lb 13.9 oz (58 kg)   SpO2 98%   BMI 30.83 kg/m   HEMODYNAMICS:    VENTILATOR SETTINGS:    INTAKE / OUTPUT: I/O last 3 completed shifts: In: 250 [IV Piggyback:250] Out: -   PHYSICAL EXAMINATION: General: elderly ill appearing female on BiPAP Neuro:  Eyes closed, no  response to sternal rub / nailbed pressure, pupils equal / sluggish 85mm, patient opened her eyes when she heard her pastors voice but no follow commands  HEENT:  BiPAP mask in place  Cardiovascular:  s1s2 irregular  Lungs:  Even/non-labored, lung bilaterally clear anterior, diminished bases  Abdomen:  Obese/soft, bsx4 active Musculoskeletal:  No acute deformities, 2+pitting edema   Skin:  Warm/dry, R thigh erythema  LABS:  BMET Recent Labs  Lab 02/12/2018 1605 02/10/18 0200  NA 134* 132*  K 4.1 4.3  CL 95* 94*  CO2 27 27  BUN 23* 24*  CREATININE 1.01* 1.07*  GLUCOSE 126* 83    Electrolytes Recent Labs  Lab 02/18/2018 1605 02/10/18 0200  CALCIUM 8.5* 8.2*    CBC Recent Labs  Lab 02/20/2018 1605 02/10/18 0200 02/10/18 0744  WBC 41.8* 39.3* 56.2*  HGB 8.2* 8.0* 7.8*  HCT 28.1* 27.5* 27.7*  PLT 222 200 202    Coag's No results for input(s): APTT, INR in the last 168 hours.  Sepsis Markers Recent Labs  Lab 02/08/2018 2043 02/10/18 0810  LATICACIDVEN 2.06* 2.2*    ABG Recent Labs  Lab 02/28/2018 1630 02/10/18 0448 02/10/18 0930  PHART 7.343* 7.326* 7.307*  PCO2ART 55.6* 56.8* 57.9*  PO2ART 127* 71.8* 72.6*    Liver Enzymes Recent Labs  Lab 02/08/2018 1605 02/10/18 0200  AST 19 21  ALT 15 14  ALKPHOS 78 69  BILITOT 0.5 0.7  ALBUMIN 2.9* 2.5*    Cardiac Enzymes Recent Labs  Lab 02/10/18 0744  TROPONINI 0.12*    Glucose Recent Labs  Lab 02/10/18 0551  GLUCAP 71    Imaging Dg Chest Port 1 View  Result Date: 02/10/2018 CLINICAL DATA:  Shortness of breath, follow-up, history atrial fibrillation, hypertension, stroke EXAM: PORTABLE CHEST 1 VIEW COMPARISON:  Portable exam 0903 hours compared to 02/21/2018 FINDINGS: Enlargement of cardiac silhouette with pulmonary vascular congestion. Atherosclerotic calcification aorta. Bibasilar pleural effusions and atelectasis greater on RIGHT. Perihilar infiltrates likely pulmonary edema. Additional opacity at  the lower RIGHT chest may represent loculated fluid or more prominent atelectasis versus consolidation though underlying mass is not excluded. Upper lungs clear. No pneumothorax. Bones demineralized. IMPRESSION: Enlargement of cardiac silhouette with pulmonary vascular congestion and probable pulmonary edema. Asymmetric pleural effusions and basilar atelectasis greater on RIGHT with more focal opacity at the lower RIGHT chest, potentially loculated fluid or consolidation though followup until resolution recommended to exclude underlying mass lesion. Electronically Signed   By: Lavonia Dana M.D.   On: 02/10/2018 10:47   Dg Chest Portable 1 View  Result Date: 02/07/2018 CLINICAL DATA:  82 year old female with shortness of breath, volume overload, legs are weeping excess fluid. EXAM: PORTABLE CHEST 1 VIEW COMPARISON:  09/22/2017 and earlier. FINDINGS: Portable AP semi upright view at 1358 hours increased bilateral pleural effusion since December, moderate bilaterally and greater on the right. No pneumothorax. No pulmonary edema in the aerated upper  lungs. Stable cardiac size and mediastinal contours. Moderate to severe underlying cardiomegaly. Stable visible upper abdomen including left upper quadrant coil pack. No acute osseous abnormality identified. IMPRESSION: Moderate bilateral pleural effusions. Underlying moderate to severe cardiomegaly. No acute pulmonary edema. Electronically Signed   By: Genevie Ann M.D.   On: 02/22/2018 14:40   [place] Ekg Site Rite  Result Date: 02/10/2018 If Site Rite image not attached, placement could not be confirmed due to current cardiac rhythm.    STUDIES:  Super D CT 03/05/17 >> near complete interval clearing of LLL lung nodule compatible with an inflammatory or infectious process, similar appearance of partially loculated small to moderate right pleural effusion with associated pleural thickening, trace left pleural fluid, moderate pericardial effusion, CAD, gallstones ECHO 5/8  >>  CT Head 5/8 >>  CT Leg 5/8 >>  CULTURES: UA 5/7 >> negative BCxx2 5/7 >>   ANTIBIOTICS: Vanco 5/7 >> Zosyn 5/8 >>  SIGNIFICANT EVENTS: 5/07  Admit with SOB, BLE   LINES/TUBES: LUE PICC 5/8 >>   DISCUSSION: 82 y/o F who presented [facility] on [facility]7 with reports of shortness of breath.    ASSESSMENT / PLAN:  PULMONARY A: Bilateral R>L Pleural Effusions - note pleural thickening lends to chronicity of effusions, might be difficult to do thora Pulmonary Hypertension - PAS 79mmHg seen on ECHO  Acute Respiratory Insufficiency  P:   Assess patient with [place] at bedside for possible thora  BiPAP PRN for increased WOB  O2 to support sats > 90% Follow CXR  DNI  Family ok with short term ventilator support. No trach / PEG or facility.   CARDIOVASCULAR A:  Shock - suspect sepsis with right thigh cellulitis, rule out pericardial tamponade with hx of effusion on CT  Moderate Pericardial Effusion - by CT on 5/8  Hx AF, HTN, HLD, Moderate MR P:  ICU monitoring  Trend troponin, lactic acid  Rate control of AF  STAT ECHO to rule out tamponade physiology  DNR in the event of arrest.    RENAL A:   Hyponatremia  P:   Trend BMP / urinary output Replace electrolytes as indicated Avoid nephrotoxic agents, ensure adequate renal perfusion Hold lasix 5/8 with hypotension  Repeat BMP at 1600  GASTROINTESTINAL A:   Hx Colon Cancer s/p Resection - FOBT positive Melena  P:   NPO with AMS  GI consulted per [facility]   HEMATOLOGIC A:   Leukocytosis  Anemia  P:  Trend CBC  INFECTIOUS A:   R Thigh Cellulitis  P:   Vanco / Zosyn as above  Follow cultures   ENDOCRINE A:   Hyperthyroidism  P:   Assess TSH Monitor glucose on BMP  Assess CBG now   NEUROLOGIC A:   Acute Metabolic Encephalopathy - unclear etiology Hx CVA P:   RASS goal: n/a  Avoid sedating medications  Frequent neuro exams  STAT CT head    FAMILY  - Updates: Sisters updated at bedside on plan of care.   Discussed with sisters at bedside.  DNR in the event of arrest, no intubation. Continue pressors / full support for now.   CC Time: 30 minutes    Noe Gens, NP-C [facility] Pgr: [phone] or if no answer [phone] 02/10/2018, 11:08 AM

## 2018-02-10 NOTE — Consult Note (Signed)
[facility] Nurse wound consult note The patient was examined in 1227.  Dressings were present to both lower legs. Reason for Consult: Bilateral lower leg wounds Wound type: The left lower leg does not have any open wounds.  The right lower leg has multiple wounded areas of unknown etiology.  I found pieces of silver hydrofiber to the right lower leg wounds, as well as, dried/crusted areas on the left lower leg. Pressure Injury POA: Yes Measurement: Right lower inner leg wound measures 0.8 cm x 0.6 cm x 0.1 cm.  It is 100% clean, pink tissue, without surrounding erythema or induration or drainage.  We can continue the Silver hydrofiber to this site.  The right lower lateral leg has a wound area that extends posteriorly to the achilles tendon area.  It measures 1 cm x 5 cm x unknown depth due to slough.  This wound bed is 100% covered in yellow-brown stringy slough.  The surrounding tissue is dry, flaky, and without erythema or induration.  The plan for this area is Santyl; Cover with saline moistened gauze, then dry gauze or ABD pad and secure with kerlex.  Change daily.  The patient can benefit from Prevalon boots which I will place an order for. Monitor the wound area(s) for worsening of condition such as: Signs/symptoms of infection,  Increase in size,  Development of or worsening of odor, Development of pain, or increased pain at the affected locations.  Notify the medical team if any of these develop.  Thank you for the consult.  Discussed plan of care with the patient and bedside nurse.  [place] nurse will not follow at this time.  Please re-consult the [facility] team if needed.  Val Riles, RN, MSN, CWOCN, CNS-BC, pager [phone]

## 2018-02-10 NOTE — Progress Notes (Signed)
PROGRESS NOTE    Nicole Hutchinson  YSA:[id number] DOB: [birth year] DOA: 02/05/2018 PCP: Renato Shin, MD    Brief Narrative: Nicole Hutchinson is a 82 y.o. female with medical history significant of  Chronic right-sided pleural effusion, chronic lower extremity edema with wounds who goes the wound care as well as chronic anticoagulation with Pradaxa secondary to atrial fibrillation brought by her 2 sisters to the ER with progressive weakness shortness of breath with exertion. She was found to have cellulitis of the right lower extremity at the inner part of the thigh and in addition became hypotensive after a dose of IV lasix,. Pt had to be transferred to ICU for pressors.     Assessment & Plan:   Active Problems:   Essential hypertension   ATRIAL FIBRILLATION   PERIPHERAL EDEMA   Iron deficiency anemia   Pleural effusion, right   Leucocytosis   GI bleed   SIRS (systemic inflammatory response syndrome) (HCC)   Acute respiratory failure with hypoxia requiring BIPAP: Unclear etiology. Pulmonary edema vs worsening right pleural effusion vs pericardial effusion.  PCCM consulted and on board.  ? Will need CT of the chest vs right sided US guided thoracentesis.  Will defer to PCCM to order it. Discussed with Brandi.  Low dose lasix as BP tolerates.    Septic shock secondary to cellulitis of the right lower extremity cellulitis requiring vasopressors Broad spectrum IV antibiotics and keep MAP>65.  Follow up blood cultures.  Get wound care consult to look at the leg weeping wounds. Trend lactic acid.    Rectal bleeding/  Acute anemia of acute blood loss : probably from pradaxa. 2 units of prbc transfusion ordered, not yet given.  Keep hemoglobin greater than 8. IV pepcid will be ordered.    Atrial Fibrillation  Rate controlled.  Off anticoagulation from rectal bleed.        DVT prophylaxis: scd's Code Status: full code.  Family Communication: sister at bedside.    Disposition Plan: pending resolution of the respiratory failure.    Consultants:   Gastroenterology  PCCM Dr Lamonte Sakai.    Procedures: echocardiogram    Antimicrobials: vancomycin and zosyn since admission.    Subjective: Does not appear to be In distress. On BIPAP with sats in low 90%.   Objective: Vitals:   02/10/18 0715 02/10/18 0719 02/10/18 0730 02/10/18 0800  BP: (!) 118/39 90/63 (!) 111/52   Pulse:  97    Resp:  (!) 21    Temp:    98.9 F (37.2 C)  TempSrc:    Axillary  SpO2:  98%    Weight:      Height:        Intake/Output Summary (Last 24 hours) at 02/10/2018 1133 Last data filed at 02/10/2018 1028 Gross per 24 hour  Intake 908.5 ml  Output 5 ml  Net 903.5 ml   Filed Weights   02/10/18 0108 02/10/18 0600  Weight: 58 kg (127 lb 13.9 oz) 58 kg (127 lb 13.9 oz)    Examination:  General exam: on BIPAP, responding minimally to pain stimuli.  Respiratory system: diminished air entry at bases, rales atbases,  Cardiovascular system: S1 & S2 heard, RRR. No JVD, murmurs, Gastrointestinal system: Abdomen is nondistended, soft and nontender.Normal bowel sounds heard. Central nervous system: minimally responsive to pain stimuli Extremities: bilateral lower extremities bandages.leg edema present.  Skin: No rashes, lesions or ulcers Psychiatry: couldn't be assessed/     Data Reviewed: I have personally reviewed following labs  and imaging studies  CBC: Recent Labs  Lab 03/05/2018 1605 02/10/18 0200 02/10/18 0744  WBC 41.8* 39.3* 56.2*  NEUTROABS 37.2*  --   --   HGB 8.2* 8.0* 7.8*  HCT 28.1* 27.5* 27.7*  MCV 65.7* 65.0* 66.7*  PLT 222 200 119   Basic Metabolic Panel: Recent Labs  Lab 02/16/2018 1605 02/10/18 0200  NA 134* 132*  K 4.1 4.3  CL 95* 94*  CO2 27 27  GLUCOSE 126* 83  BUN 23* 24*  CREATININE 1.01* 1.07*  CALCIUM 8.5* 8.2*   GFR: Estimated Creatinine Clearance: 27.9 mL/min (A) (by C-G formula based on SCr of 1.07 mg/dL (H)). Liver  Function Tests: Recent Labs  Lab 02/13/2018 1605 02/10/18 0200  AST 19 21  ALT 15 14  ALKPHOS 78 69  BILITOT 0.5 0.7  PROT 5.9* 5.1*  ALBUMIN 2.9* 2.5*   No results for input(s): LIPASE, AMYLASE in the last 168 hours. No results for input(s): AMMONIA in the last 168 hours. Coagulation Profile: No results for input(s): INR, PROTIME in the last 168 hours. Cardiac Enzymes: Recent Labs  Lab 02/10/18 0744  TROPONINI 0.12*   BNP (last 3 results) Recent Labs    01/04/18 1214  PROBNP 324.0*   HbA1C: No results for input(s): HGBA1C in the last 72 hours. CBG: Recent Labs  Lab 02/10/18 0551  GLUCAP 71   Lipid Profile: No results for input(s): CHOL, HDL, LDLCALC, TRIG, CHOLHDL, LDLDIRECT in the last 72 hours. Thyroid Function Tests: No results for input(s): TSH, T4TOTAL, FREET4, T3FREE, THYROIDAB in the last 72 hours. Anemia Panel: No results for input(s): VITAMINB12, FOLATE, FERRITIN, TIBC, IRON, RETICCTPCT in the last 72 hours. Sepsis Labs: Recent Labs  Lab 02/04/2018 2043 02/10/18 0810  LATICACIDVEN 2.06* 2.2*    Recent Results (from the past 240 hour(s))  Blood culture (routine x 2)     Status: None (Preliminary result)   Collection Time: 03/01/2018  7:39 PM  Result Value Ref Range Status   Specimen Description   Final    BLOOD RIGHT ARM Performed at [facility] [address] [place], [place] [address]    Special Requests   Final    BOTTLES DRAWN AEROBIC ONLY Blood Culture adequate volume   Culture   Final    NO GROWTH < 12 HOURS Performed at [facility], [place] [address], [place], [place] [address]    Report Status PENDING  Incomplete  Blood culture (routine x 2)     Status: None (Preliminary result)   Collection Time: 02/03/2018  7:39 PM  Result Value Ref Range Status   Specimen Description   Final    BLOOD RIGHT ARM Performed at [facility] [address] [place], [place] [address]    Special Requests   Final     BOTTLES DRAWN AEROBIC ONLY Blood Culture adequate volume   Culture   Final    NO GROWTH < 12 HOURS Performed at [facility], [place] [address], [place],  [address]    Report Status PENDING  Incomplete         Radiology Studies: Dg Chest Port 1 View  Result Date: 02/10/2018 CLINICAL DATA:  Shortness of breath, follow-up, history atrial fibrillation, hypertension, stroke EXAM: PORTABLE CHEST 1 VIEW COMPARISON:  Portable exam 0903 hours compared to 02/17/2018 FINDINGS: Enlargement of cardiac silhouette with pulmonary vascular congestion. Atherosclerotic calcification aorta. Bibasilar pleural effusions and atelectasis greater on RIGHT. Perihilar infiltrates likely pulmonary edema. Additional  opacity at the lower RIGHT chest may represent loculated fluid or more prominent atelectasis versus consolidation though underlying mass is not excluded. Upper lungs clear. No pneumothorax. Bones demineralized. IMPRESSION: Enlargement of cardiac silhouette with pulmonary vascular congestion and probable pulmonary edema. Asymmetric pleural effusions and basilar atelectasis greater on RIGHT with more focal opacity at the lower RIGHT chest, potentially loculated fluid or consolidation though followup until resolution recommended to exclude underlying mass lesion. Electronically Signed   By: Lavonia Dana M.D.   On: 02/10/2018 10:47   Dg Chest Portable 1 View  Result Date: 02/24/2018 CLINICAL DATA:  82 year old female with shortness of breath, volume overload, legs are weeping excess fluid. EXAM: PORTABLE CHEST 1 VIEW COMPARISON:  09/22/2017 and earlier. FINDINGS: Portable AP semi upright view at 1358 hours increased bilateral pleural effusion since December, moderate bilaterally and greater on the right. No pneumothorax. No pulmonary edema in the aerated upper lungs. Stable cardiac size and mediastinal contours. Moderate to severe underlying cardiomegaly. Stable visible upper abdomen including left upper  quadrant coil pack. No acute osseous abnormality identified. IMPRESSION: Moderate bilateral pleural effusions. Underlying moderate to severe cardiomegaly. No acute pulmonary edema. Electronically Signed   By: Genevie Ann M.D.   On: 02/05/2018 14:40   [place] Ekg Site Rite  Result Date: 02/10/2018 If Site Rite image not attached, placement could not be confirmed due to current cardiac rhythm.       Scheduled Meds: . acetaminophen  650 mg Oral Once  . diphenhydrAMINE  25 mg Intravenous Once  . furosemide  20 mg Oral Daily  . latanoprost  1 drop Both Eyes QHS  . loratadine  10 mg Oral Daily  . pravastatin  80 mg Oral q1800  . sodium chloride flush  10-40 mL Intracatheter Q12H   Continuous Infusions: . sodium chloride 75 mL/hr at 02/10/18 0207  . sodium chloride    . famotidine (PEPCID) IV Stopped (02/10/18 1020)  . phenylephrine (NEO-SYNEPHRINE) Adult infusion    . piperacillin-tazobactam (ZOSYN)  IV    . [START ON 02/11/2018] vancomycin       LOS: 1 day    Time spent: 45 minutes.     Hosie Poisson, MD [facility] Pager [phone]   If 7PM-7AM, please contact night-coverage [email or web address] Password [facility] 02/10/2018, 11:33 AM

## 2018-02-10 NOTE — Progress Notes (Signed)
  Echocardiogram 2D Echocardiogram has been performed.  Nicole Hutchinson F 02/10/2018, 1:29 PM

## 2018-02-10 NOTE — Progress Notes (Signed)
Peripherally Inserted Central Catheter/Midline Placement  The IV Nurse has discussed with the patient and/or persons authorized to consent for the patient, the purpose of this procedure and the potential benefits and risks involved with this procedure.  The benefits include less needle sticks, lab draws from the catheter, and the patient may be discharged home with the catheter. Risks include, but not limited to, infection, bleeding, blood clot (thrombus formation), and puncture of an artery; nerve damage and irregular heartbeat and possibility to perform a PICC exchange if needed/ordered by physician.  Alternatives to this procedure were also discussed.  Bard Power PICC patient education guide, fact sheet on infection prevention and patient information card has been provided to patient /or left at bedside.    PICC/Midline Placement Documentation    Consent obtained with sister at bedside    Nicole Hutchinson 02/10/2018, 11:20 AM

## 2018-02-10 NOTE — Consult Note (Addendum)
Referring Provider: [facility]  Primary Care Physician:  Renato Shin, MD Primary Gastroenterologist:  Scarlette Shorts, MD  Reason for Consultation:   Anemia, heme positive stool   ASSESSMENT AND PLAN:    67. 82 yo female with heme + stool and new microcytic anemia on pradaxa. Patient unable to provide history right now due to mental status changes and on BiPap.  -two sisters here, patient lives with one. One day approximately 2 months ago patient mentioned that stools were dark. Other than that she hasn't complained to sister about any GI bleeding, abdominal pain, vomiting, etc... -will need to workup microcytic anemia when acute medical issues resolve. In interim she is already getting blood transfusion. IV pepcid fine since no overt GI bleeding at present.    2. Marked leukocytosis, ? Sepsis from cellulitis  3. Elevated troponin - ? Secondary to sepsis shock / ? Demand related to anemia.  4. Hx of colon neoplasia,  s/p resection of rectosigmoid colon > 5 years ago for a large villous adenoma of distal sigmoid with multiple other polyps in area.   -She is up to date on surveillance colonoscopy, last one was Dec 2016 with findings of 6 polyps between 3 and 5 mm in size found at the cecum, in the ascending colon and descending colon.  Polypectomies performed.  Pathology compatible with tubular adenomas without HGD  HPI: Nicole Hutchinson is a 82 y.o. female with of colon cancer,  AFib on pradaxa, chronic edema. Presented to ED yesterday with SOB, orthopnea. EMS found her hypoxic. Admitted with right thigh cellulitis, hypoxia. Found to have bilateral pleural effusions on CXR and labs pertinent  For leukocytosis and anemia. Hgb of ~8, down from 12 last years. On exam she had brown but heme positive stool. She became hypotensive with mental status changes overnight and transferred to ICU. PCCM following. Shock suspected, ? Sepsis from cellulitis. On Vanco and Zosyn. Blood cultures pending.  Echo results pending to rule out pericardial tamponade given hx of pericardial effusion (CT scan a year ago).     Past Medical History:  Diagnosis Date  . Atrial fibrillation ([place]) 2008   On Pradaxa  . Dyslipidemia   . HTN (hypertension)   . Hyperglycemia   . Hyperthyroidism   . Moderate mitral regurgitation by prior echocardiogram    Normal left ventricular function left atrial enlargement echo 2010  . Osteoarthritis   . Osteoporosis   . Stroke ([place])   . Tubulovillous adenoma of colon 2002    Past Surgical History:  Procedure Laterality Date  . ABDOMINAL HYSTERECTOMY    . COLON RESECTION  2006  . DEXA  12/29/2005  . right hemi-thyroidectomy    . stress cardiolite  09/20/2003    Prior to Admission medications   Medication Sig Start Date End Date Taking? Authorizing Provider  dabigatran (PRADAXA) 150 MG CAPS capsule Take 1 capsule (150 mg total) by mouth 2 (two) times daily. 01/08/18  Yes Renato Shin, MD  furosemide (LASIX) 20 MG tablet Take 1 tablet (20 mg total) by mouth daily. 01/04/18  Yes Renato Shin, MD  latanoprost (XALATAN) 0.005 % ophthalmic solution INSTILL ONE DROP INTO [facility] EYE AT BEDTIME 05/30/15  Yes Renato Shin, MD  loratadine (CLARITIN) 10 MG tablet Take 10 mg by mouth daily.   Yes [provider]  losartan (COZAAR) 50 MG tablet Take 1 tablet (50 mg total) by mouth daily. 01/04/18  Yes Renato Shin, MD  lovastatin (MEVACOR) 40 MG tablet Take 2  tablets (80 mg total) by mouth at bedtime. 01/08/18  Yes Renato Shin, MD  fluticasone (FLONASE) 50 MCG/ACT nasal spray USE 2 SPRAYS IN EACH NOSTRIL EVERY DAY Patient taking differently: Place 1 spray into both nostrils daily as needed for allergies.  01/07/18   Renato Shin, MD    Current Facility-Administered Medications  Medication Dose Route Frequency Provider Last Rate Last Dose  . 0.9 %  sodium chloride infusion   Intravenous Continuous Elwyn Reach, MD 75 mL/hr at 02/10/18 0207    . 0.9 %  sodium  chloride infusion   Intravenous Once Jani Gravel, MD      . acetaminophen (TYLENOL) tablet 650 mg  650 mg Oral Once Jani Gravel, MD      . collagenase (SANTYL) ointment   Topical Daily Hosie Poisson, MD      . famotidine (PEPCID) IVPB 20 mg premix  20 mg Intravenous Q12H Elwyn Reach, MD   Stopped at 02/10/18 1020  . hydrocortisone sodium succinate (SOLU-CORTEF) 100 MG injection 50 mg  50 mg Intravenous Q6H Ollis, Brandi L, NP   50 mg at 02/10/18 1341  . latanoprost (XALATAN) 0.005 % ophthalmic solution 1 drop  1 drop Both Eyes QHS Elwyn Reach, MD   1 drop at 02/10/18 0206  . norepinephrine (LEVOPHED) 4 mg in dextrose 5 % 250 mL (0.016 mg/mL) infusion  0-40 mcg/min Intravenous Titrated Ollis, Brandi L, NP 7.5 mL/hr at 02/10/18 1342 2 mcg/min at 02/10/18 1342  . phenylephrine (NEO-SYNEPHRINE) 40 mg in sodium chloride 0.9 % 250 mL (0.16 mg/mL) infusion  0-400 mcg/min Intravenous Titrated Collene Gobble, MD 75 mL/hr at 02/10/18 1357 200 mcg/min at 02/10/18 1357  . piperacillin-tazobactam (ZOSYN) IVPB 3.375 g  3.375 g Intravenous Q8H Garba, Mohammad L, MD 12.5 mL/hr at 02/10/18 1341 3.375 g at 02/10/18 1341  . sodium chloride flush (NS) 0.9 % injection 10-40 mL  10-40 mL Intracatheter Q12H Hosie Poisson, MD      . sodium chloride flush (NS) 0.9 % injection 10-40 mL  10-40 mL Intracatheter PRN Hosie Poisson, MD      . Derrill Memo ON 02/11/2018] vancomycin (VANCOCIN) 500 mg in sodium chloride 0.9 % 100 mL IVPB  500 mg Intravenous Q48H Elwyn Reach, MD        Allergies as of 02/08/2018 - Review Complete 02/28/2018  Allergen Reaction Noted  . Ace inhibitors  04/30/2009  . Codeine      Family History  Problem Relation Age of Onset  . Lung cancer Paternal Uncle   . Pancreatic cancer Paternal Uncle   . Lung cancer Paternal Aunt     Social History   Socioeconomic History  . Marital status: Single    Spouse name: Not on file  . Number of children: Not on file  . Years of education: Not on  file  . Highest education level: Not on file  Occupational History  . Occupation: Retired  Scientific laboratory technician  . Financial resource strain: Not on file  . Food insecurity:    Worry: Not on file    Inability: Not on file  . Transportation needs:    Medical: Not on file    Non-medical: Not on file  Tobacco Use  . Smoking status: Never Smoker  . Smokeless tobacco: Never Used  Substance and Sexual Activity  . Alcohol use: No    Alcohol/week: 0.0 oz  . Drug use: No  . Sexual activity: Not on file  Lifestyle  . Physical activity:  Days per week: Not on file    Minutes per session: Not on file  . Stress: Not on file  Relationships  . Social connections:    Talks on phone: Not on file    Gets together: Not on file    Attends religious service: Not on file    Active member of club or organization: Not on file    Attends meetings of clubs or organizations: Not on file    Relationship status: Not on file  . Intimate partner violence:    Fear of current or ex partner: Not on file    Emotionally abused: Not on file    Physically abused: Not on file    Forced sexual activity: Not on file  Other Topics Concern  . Not on file  Social History Narrative   Retired.    Lives with sister, Nicole Hutchinson.     Review of Systems: Unable to obtain due to altered mental status and on Bipap.  Physical Exam: Vital signs in last 24 hours: Temp:  [97.6 F (36.4 C)-99.4 F (37.4 C)] 98.6 F (37 C) (05/08 1255) Pulse Rate:  [80-109] 99 (05/08 1230) Resp:  [12-34] 15 (05/08 1332) BP: (62-164)/(29-119) 118/52 (05/08 1332) SpO2:  [71 %-100 %] 100 % (05/08 1332) Weight:  [127 lb 13.9 oz (58 kg)] 127 lb 13.9 oz (58 kg) (05/08 0600) Last BM Date: 02/10/18 General:   White female lying in bed in NAD Eyes:  Keeps eyes closed.  Ears:  Can't assess. Nose:  No deformity, discharge,  or lesions. Neck:  Supple; no masses Lungs:  Anterior -clear. On Bipap.   Heart:  Regular rate, Irreg rhythm. BLE  edema 2+.  Abdomen:  Soft, non-distended, doesn't appear tender. BS active, no palp mass Rectal:  Deferred  Msk:  Symmetrical without gross deformities. . Neurologic:  Minimally responsive, opens eyes for a second with enough stimulus. Skin:  Intact without significant lesions or rashes..   Intake/Output from previous day: 05/07 0701 - 05/08 0700 In: 250 [IV Piggyback:250] Out: -  Intake/Output this shift: Total I/O In: 658.5 [I.V.:558.5; IV Piggyback:100] Out: 5 [Urine:5]  Lab Results: Recent Labs    02/22/2018 1605 02/10/18 0200 02/10/18 0744  WBC 41.8* 39.3* 56.2*  HGB 8.2* 8.0* 7.8*  HCT 28.1* 27.5* 27.7*  PLT 222 200 202   BMET Recent Labs    02/08/2018 1605 02/10/18 0200  NA 134* 132*  K 4.1 4.3  CL 95* 94*  CO2 27 27  GLUCOSE 126* 83  BUN 23* 24*  CREATININE 1.01* 1.07*  CALCIUM 8.5* 8.2*   LFT Recent Labs    02/10/18 0200  PROT 5.1*  ALBUMIN 2.5*  AST 21  ALT 14  ALKPHOS 69  BILITOT 0.7   PT/INR No results for input(s): LABPROT, INR in the last 72 hours. Hepatitis Panel No results for input(s): HEPBSAG, HCVAB, HEPAIGM, HEPBIGM in the last 72 hours.    Studies/Results: Dg Chest Port 1 View  Result Date: 02/10/2018 CLINICAL DATA:  Shortness of breath, follow-up, history atrial fibrillation, hypertension, stroke EXAM: PORTABLE CHEST 1 VIEW COMPARISON:  Portable exam 0903 hours compared to 02/05/2018 FINDINGS: Enlargement of cardiac silhouette with pulmonary vascular congestion. Atherosclerotic calcification aorta. Bibasilar pleural effusions and atelectasis greater on RIGHT. Perihilar infiltrates likely pulmonary edema. Additional opacity at the lower RIGHT chest may represent loculated fluid or more prominent atelectasis versus consolidation though underlying mass is not excluded. Upper lungs clear. No pneumothorax. Bones demineralized. IMPRESSION: Enlargement of cardiac silhouette  with pulmonary vascular congestion and probable pulmonary edema.  Asymmetric pleural effusions and basilar atelectasis greater on RIGHT with more focal opacity at the lower RIGHT chest, potentially loculated fluid or consolidation though followup until resolution recommended to exclude underlying mass lesion. Electronically Signed   By: Lavonia Dana M.D.   On: 02/10/2018 10:47   Dg Chest Portable 1 View  Result Date: 02/14/2018 CLINICAL DATA:  82 year old female with shortness of breath, volume overload, legs are weeping excess fluid. EXAM: PORTABLE CHEST 1 VIEW COMPARISON:  09/22/2017 and earlier. FINDINGS: Portable AP semi upright view at 1358 hours increased bilateral pleural effusion since December, moderate bilaterally and greater on the right. No pneumothorax. No pulmonary edema in the aerated upper lungs. Stable cardiac size and mediastinal contours. Moderate to severe underlying cardiomegaly. Stable visible upper abdomen including left upper quadrant coil pack. No acute osseous abnormality identified. IMPRESSION: Moderate bilateral pleural effusions. Underlying moderate to severe cardiomegaly. No acute pulmonary edema. Electronically Signed   By: Genevie Ann M.D.   On: 03/05/2018 14:40   [place] Ekg Site Rite  Result Date: 02/10/2018 If Site Rite image not attached, placement could not be confirmed due to current cardiac rhythm.    Tye Savoy, NP-C @  02/10/2018, 2:02 PM  Pager number [phone]   Attending physician's note   I have taken a history, examined the patient and reviewed the chart. I agree with the Advanced Practitioner's note, impression and recommendations.  70 yr F with chronic R pleural effusion, loculated, Afib on Pradaxa admitted with worsening shortness of breath and confusion.  Leukocytosis with WBC count ~50K, thought to be secondary to cellulitis with sepsis GI consulted for anemia with heme positive stool. She has had multiple colonoscopies and is upto date with surveillance, small adenomas were removed on last colonoscopy in 2016. No  EGD No overt GI bleed Patient is on bipap Will consider further work up of anemia once acute issues resolve or patient develops overt GI bleed  Continue IV pepcid  Available if have any questions   K. Denzil Magnuson , MD [phone]

## 2018-02-10 NOTE — Progress Notes (Signed)
CRITICAL VALUE ALERT  Critical Value:  Trop 0.80  Date & Time Notied:  02/10/18 1847  Provider Notified: Lamonte Sakai  Orders Received/Actions taken: MD paged

## 2018-02-10 NOTE — Progress Notes (Addendum)
Spoke with Dr. Karleen Hampshire regarding PRBC. Instructed to transfuse 2 units. Currently only have 1 PIV. Informed MD, a midline or PICC will be needed in order to start blood and continue running Neo. Will transfuse PRBC when additional access is obtained. Discussed maintenance fluids to begin running at 75 cc/h AFTER PRBC transfusion complete.    MD informed no urine output had been recorded; Bladder scan revealed 200 cc in bladder. No new orders at this time. Will continue to monitor closely.

## 2018-02-10 NOTE — Progress Notes (Signed)
CRITICAL VALUE ALERT  Critical Value:  Trop 0.12  Date & Time Notied:  02/10/18 0859  Provider Notified: Karleen Hampshire.   Orders Received/Actions taken: MD paged.

## 2018-02-11 DIAGNOSIS — R0902 Hypoxemia: Secondary | ICD-10-CM

## 2018-02-11 LAB — CBC
HCT: 33.3 % — ABNORMAL LOW (ref 36.0–46.0)
HCT: 31.3 % — ABNORMAL LOW (ref 36.0–46.0)
HEMOGLOBIN: 10.1 g/dL — AB (ref 12.0–15.0)
Hemoglobin: 9.6 g/dL — ABNORMAL LOW (ref 12.0–15.0)
MCH: 21.5 pg — AB (ref 26.0–34.0)
MCH: 21.5 pg — AB (ref 26.0–34.0)
MCHC: 30.3 g/dL (ref 30.0–36.0)
MCHC: 30.7 g/dL (ref 30.0–36.0)
MCV: 70.9 fL — ABNORMAL LOW (ref 78.0–100.0)
MCV: 70.2 fL — ABNORMAL LOW (ref 78.0–100.0)
PLATELETS: 142 10*3/uL — AB (ref 150–400)
Platelets: 164 10*3/uL (ref 150–400)
RBC: 4.7 MIL/uL (ref 3.87–5.11)
RBC: 4.46 MIL/uL (ref 3.87–5.11)
RDW: 23 % — ABNORMAL HIGH (ref 11.5–15.5)
RDW: 23.5 % — ABNORMAL HIGH (ref 11.5–15.5)
WBC: 41.2 10*3/uL — ABNORMAL HIGH (ref 4.0–10.5)
WBC: 33.4 10*3/uL — ABNORMAL HIGH (ref 4.0–10.5)

## 2018-02-11 LAB — COMPREHENSIVE METABOLIC PANEL
ALK PHOS: 73 U/L (ref 38–126)
ALT: 21 U/L (ref 14–54)
AST: 27 U/L (ref 15–41)
Albumin: 2.2 g/dL — ABNORMAL LOW (ref 3.5–5.0)
Anion gap: 11 (ref 5–15)
BILIRUBIN TOTAL: 0.6 mg/dL (ref 0.3–1.2)
BUN: 35 mg/dL — AB (ref 6–20)
CALCIUM: 7.5 mg/dL — AB (ref 8.9–10.3)
CO2: 23 mmol/L (ref 22–32)
CREATININE: 1.58 mg/dL — AB (ref 0.44–1.00)
Chloride: 97 mmol/L — ABNORMAL LOW (ref 101–111)
GFR calc Af Amer: 35 mL/min — ABNORMAL LOW (ref >60)
GFR calc non Af Amer: 30 mL/min — ABNORMAL LOW (ref >60)
GLUCOSE: 136 mg/dL — AB (ref 65–99)
Potassium: 4.4 mmol/L (ref 3.5–5.1)
Sodium: 131 mmol/L — ABNORMAL LOW (ref 135–145)
TOTAL PROTEIN: 4.9 g/dL — AB (ref 6.5–8.1)

## 2018-02-11 LAB — DIFFERENTIAL
BASOS PCT: 0 %
Basophils Absolute: 0 10*3/uL (ref 0.0–0.1)
EOS ABS: 0 10*3/uL (ref 0.0–0.7)
Eosinophils Relative: 0 %
LYMPHS PCT: 3 %
Lymphs Abs: 1.2 10*3/uL (ref 0.7–4.0)
MONO ABS: 2.5 10*3/uL — AB (ref 0.1–1.0)
Monocytes Relative: 6 %
NEUTROS ABS: 37.5 10*3/uL — AB (ref 1.7–7.7)
Neutrophils Relative %: 91 %

## 2018-02-11 LAB — TROPONIN I
Troponin I: 0.85 ng/mL (ref <0.03)
Troponin I: 0.71 ng/mL (ref <0.03)
Troponin I: 0.74 ng/mL (ref <0.03)

## 2018-02-11 LAB — LACTIC ACID, PLASMA: Lactic Acid, Venous: 1.1 mmol/L (ref 0.5–1.9)

## 2018-02-11 LAB — PROCALCITONIN: Procalcitonin: 3.84 ng/mL

## 2018-02-11 MED ORDER — FAMOTIDINE IN NACL 20-0.9 MG/50ML-% IV SOLN
20.0000 mg | INTRAVENOUS | Status: DC
  Administered 2018-02-11 – 2018-02-14 (×4): 20 mg via INTRAVENOUS
  Filled 2018-02-11 (×4): qty 50

## 2018-02-11 MED ORDER — VANCOMYCIN HCL 500 MG IV SOLR: 500.0000 mg | INTRAVENOUS | Status: DC

## 2018-02-11 MED ORDER — CHLORHEXIDINE GLUCONATE CLOTH 2 % EX PADS
6.0000 | MEDICATED_PAD | Freq: Every day | CUTANEOUS | Status: DC
Start: 2018-02-12 — End: 2018-02-15
  Administered 2018-02-12 – 2018-02-14 (×3): 6 via TOPICAL

## 2018-02-11 MED ORDER — SODIUM CHLORIDE 0.9 % IV SOLN
1.0000 g | INTRAVENOUS | Status: DC
  Administered 2018-02-11: 1 g via INTRAVENOUS
  Filled 2018-02-11: qty 1

## 2018-02-11 MED ORDER — PIPERACILLIN-TAZOBACTAM IN DEX 2-0.25 GM/50ML IV SOLN
2.2500 g | Freq: Three times a day (TID) | INTRAVENOUS | Status: DC
  Administered 2018-02-11: 2.25 g via INTRAVENOUS
  Filled 2018-02-11 (×2): qty 50

## 2018-02-11 NOTE — Progress Notes (Signed)
Spoke with Dr. Silverio Decamp who stated it was okay to feed patient from GI perspective. Spoke with Darel Hong, NP regarding  concern surrounding patient's risk for aspiration. NP okayed order for swallow evaluation. Spoke to [place] who felt that a modified barium was indicated given the patients history; order was placed for modified and should take place tomorrow 02/12/18.

## 2018-02-11 NOTE — Progress Notes (Signed)
Patient still on vasopressors/ LEVOPHED and on PCCM service, TR[facility] will sign off . Please call for questions.   Hosie Poisson, MD [phone]

## 2018-02-11 NOTE — Consult Note (Signed)
PULMONARY / CRITICAL CARE MEDICINE   Name: Nicole Hutchinson MRN: [id number] DOB: Nov 02, 1935    ADMISSION DATE:  02/24/2018 CONSULTATION DATE:  02/10/18  REFERRING MD:  Dr. Karleen Hampshire   CHIEF COMPLAINT:  SOB, LE Swelling  HISTORY OF PRESENT ILLNESS:   82 y/o F, never smoker, who presented to [facility] on 5/7 with complaints of shortness of breath.   The patient's sister called her Cardiologist on 5/6 with c/o SOB and LE swelling.  She recently was started on lasix by her PCP.  She has chronic LE wounds and is followed by wound care.  Per notes, recently had BLE dopplers that were negative. Acute symptoms began around May 1st. She lives with her sister who reported she was more sleepy and had difficulty lying flat.  Her pastor noted she had been sleeping during church for the last two months.  She is on pradaxa for AF.  Sister called EMS 5/7 for increasing SOB.  EMS found her to be hypoxic with sats in the 80's on arrival.  She was admitted per [facility] to further evaluate hypoxic respiratory failure.  Work up notable for large right effusion on CXR, leukocytosis, FOBT positive and right thigh cellulitis.  UA negative.  Cultures pending.  The patient developed hypotension overnight on 5/8 and was transferred to ICU.  She required vasopressors for MAP.  She had altered mental status.  ABG 7.326 / 56.8 / 71 / 28.    PCCM consulted for pleural effusion and hypotension.    .   SUBJECTIVE:  Currently off BiPAP awake alert follows commands.  Levophed is weaning currently on 4 mics  VITAL SIGNS: BP (!) 121/39   Pulse 87   Temp 97.6 F (36.4 C) (Oral)   Resp 20   Ht 4\' 6"  (1.372 m)   Wt 58 kg (127 lb 13.9 oz)   SpO2 93%   BMI 30.83 kg/m   HEMODYNAMICS:    VENTILATOR SETTINGS: FiO2 (%):  [40 %] 40 %  INTAKE / OUTPUT: I/O last 3 completed shifts: In: 2019.4 [I.V.:1469.4; IV Piggyback:550] Out: 5 [Urine:5]  PHYSICAL EXAMINATION: General: Frail elderly female currently off noninvasive mechanical  ventilatory support with sats of 90%  HEENT: No JVD or lymphadenopathy appreciated Neuro: Follows commands moves all extremities CV: s1s2 rrr, no m/r/g PULM: Shallow respirations decreased breath sounds in bases CX:KGYJ, non-tender, bsx4 active  Extremities: warm/dry, 1+ edema  Skin: Warm noted to have right thigh erythema   LABS:  BMET Recent Labs  Lab 02/10/18 0200 02/10/18 1740 02/11/18 0625  NA 132* 133* 131*  K 4.3 4.3 4.4  CL 94* 98* 97*  CO2 27 25 23   BUN 24* 29* 35*  CREATININE 1.07* 1.39* 1.58*  GLUCOSE 83 100* 136*    Electrolytes Recent Labs  Lab 02/10/18 0200 02/10/18 1740 02/11/18 0625  CALCIUM 8.2* 7.5* 7.5*    CBC Recent Labs  Lab 02/10/18 0744 02/10/18 1740 02/11/18 0104  WBC 56.2* 48.2* 41.2*  HGB 7.8* 10.0* 10.1*  HCT 27.7* 33.8* 33.3*  PLT 202 175 164    Coag's No results for input(s): APTT, INR in the last 168 hours.  Sepsis Markers Recent Labs  Lab 03/01/2018 2043 02/10/18 0810  LATICACIDVEN 2.06* 2.2*    ABG Recent Labs  Lab 02/14/2018 1630 02/10/18 0448 02/10/18 0930  PHART 7.343* 7.326* 7.307*  PCO2ART 55.6* 56.8* 57.9*  PO2ART 127* 71.8* 72.6*    Liver Enzymes Recent Labs  Lab 02/08/2018 1605 02/10/18 0200 02/11/18 0625  AST  19 21 27   ALT 15 14 21   ALKPHOS 78 69 73  BILITOT 0.5 0.7 0.6  ALBUMIN 2.9* 2.5* 2.2*    Cardiac Enzymes Recent Labs  Lab 02/10/18 1740 02/11/18 0104 02/11/18 0625  TROPONINI 0.80* 0.85* 0.71*    Glucose Recent Labs  Lab 02/10/18 0551 02/10/18 1303  GLUCAP 71 85    Imaging Ct Head Wo Contrast  Result Date: 02/10/2018 CLINICAL DATA:  Shortness of breath, orthopnea, hypoxia, hypotensive, mental status changes, atrial fibrillation, colon cancer, hypertension, stroke EXAM: CT HEAD WITHOUT CONTRAST TECHNIQUE: Contiguous axial images were obtained from the base of the skull through the vertex without intravenous contrast. Sagittal and coronal MPR images reconstructed from axial data  set. COMPARISON:  06/21/2010 FINDINGS: Brain: Minimal atrophy. Normal ventricular morphology. No midline shift or mass effect. No intracranial hemorrhage, mass lesion, or evidence of acute infarction. No extra-axial fluid collections. Tiny old lacunar infarct within the LEFT caudate. Vascular: Atherosclerotic calcifications of internal carotid and vertebral arteries at skull base. No hyperdense vessels. Skull: Demineralized but intact Sinuses/Orbits: Clear Other: N/A IMPRESSION: No acute intracranial abnormalities. Electronically Signed   By: Lavonia Dana M.D.   On: 02/10/2018 16:33   Dg Chest Port 1 View  Result Date: 02/10/2018 CLINICAL DATA:  Shortness of breath, follow-up, history atrial fibrillation, hypertension, stroke EXAM: PORTABLE CHEST 1 VIEW COMPARISON:  Portable exam 0903 hours compared to 02/11/2018 FINDINGS: Enlargement of cardiac silhouette with pulmonary vascular congestion. Atherosclerotic calcification aorta. Bibasilar pleural effusions and atelectasis greater on RIGHT. Perihilar infiltrates likely pulmonary edema. Additional opacity at the lower RIGHT chest may represent loculated fluid or more prominent atelectasis versus consolidation though underlying mass is not excluded. Upper lungs clear. No pneumothorax. Bones demineralized. IMPRESSION: Enlargement of cardiac silhouette with pulmonary vascular congestion and probable pulmonary edema. Asymmetric pleural effusions and basilar atelectasis greater on RIGHT with more focal opacity at the lower RIGHT chest, potentially loculated fluid or consolidation though followup until resolution recommended to exclude underlying mass lesion. Electronically Signed   By: Lavonia Dana M.D.   On: 02/10/2018 10:47   Ct Extremity Lower Right Wo Contrast  Result Date: 02/10/2018 CLINICAL DATA:  Soft tissue swelling, cellulitis. EXAM: CT OF THE LOWER RIGHT EXTREMITY WITHOUT CONTRAST TECHNIQUE: Multidetector CT imaging of the right lower extremity was performed  according to the standard protocol. COMPARISON:  None. FINDINGS: Bones/Joint/Cartilage No fracture or dislocation. Normal alignment. No joint effusion. No periosteal reaction or bone destruction. No aggressive osseous lesion. Ligaments Ligaments are suboptimally evaluated by CT. Muscles and Tendons Mild generalized muscle atrophy. No intramuscular fluid collection or hematoma. Soft tissue No fluid collection or hematoma.  No soft tissue mass. Anasarca is noted. Soft tissue edema in the subcutaneous fat of bilateral lower extremities likely secondary to fluid overload. Peripheral vascular atherosclerotic disease. IMPRESSION: 1. No osteomyelitis of the right lower extremity. 2. Anasarca with soft tissue edema in the subcutaneous fat of the lower abdominal wall and bilateral lower extremities. 3. Peripheral vascular atherosclerotic disease. Electronically Signed   By: Kathreen Devoid   On: 02/10/2018 16:41     STUDIES:  Super D CT 03/05/17 >> near complete interval clearing of LLL lung nodule compatible with an inflammatory or infectious process, similar appearance of partially loculated small to moderate right pleural effusion with associated pleural thickening, trace left pleural fluid, moderate pericardial effusion, CAD, gallstones ECHO 5/8 >> EF 65% cannot rule out tamponade physiology CT Head 5/8 >> no acute intracranial abnormality CT Leg 5/8 >>  CULTURES: UA  5/7 >> negative BCxx2 5/7 >>   ANTIBIOTICS: Vanco 5/7 >> Zosyn 5/8 >>  SIGNIFICANT EVENTS: 5/07  Admit with SOB, BLE   LINES/TUBES: LUE PICC 5/8 >>   DISCUSSION: 82 y/o F who presented to [facility] on 5/7 with reports of shortness of breath.    ASSESSMENT / PLAN:  PULMONARY A: Bilateral R>L Pleural Effusions - note pleural thickening lends to chronicity of effusions, might be difficult to do thora Pulmonary Hypertension - PAS 64mmHg seen on ECHO  Acute Respiratory Insufficiency  P:   Taken off BiPAP 02/11/2018 Oxygen therapy as  needed PRN BiPAP Repeat chest x-ray to follow right greater than left pleural effusion   CARDIOVASCULAR A:  Shock - suspect sepsis with right thigh cellulitis, rule out pericardial tamponade with hx of effusion on CT  Moderate Pericardial Effusion - by CT on 5/8  Hx AF, HTN, HLD, Moderate MR P:  ICU hemodynamic monitoring Follow metabolic trends Note she had a 2D echo showed an EF of 65 to 70% small to moderate pericardial effusion.  The tamponade physiology could be excluded on the basis of the study. Wean vasopressors as tolerated  RENAL Lab Results  Component Value Date   CREATININE 1.58 (H) 02/11/2018   CREATININE 1.39 (H) 02/10/2018   CREATININE 1.07 (H) 02/10/2018   CREATININE 0.69 07/08/2012   Recent Labs  Lab 02/10/18 0200 02/10/18 1740 02/11/18 0625  NA 132* 133* 131*   Recent Labs  Lab 02/10/18 0200 02/10/18 1740 02/11/18 0625  K 4.3 4.3 4.4    A:   Hyponatremia  P:   Follow basic metabolic package Hold diuresis due to hypotension Monitor sodium  GASTROINTESTINAL A:   Hx Colon Cancer s/p Resection - FOBT positive Melena  P:   Currently n.p.o. but may advance diet future GI was consulted on 02/10/2018 and are following.   HEMATOLOGIC Recent Labs    02/10/18 1740 02/11/18 0104  HGB 10.0* 10.1*    A:   Leukocytosis  Anemia  P:  Follow CBC  INFECTIOUS A:   R Thigh Cellulitis  P:   Vancomycin Zosyn as above Follow cultures    ENDOCRINE CBG (last 3)  Recent Labs    02/10/18 0551 02/10/18 1303  GLUCAP 71 85    A:   Hyperthyroidism  P:   TSH [id number] 2019 Monitor CBG  NEUROLOGIC A:   Acute Metabolic Encephalopathy - unclear etiology Hx CVA P:   02/11/2018 awake and follows commands 02/10/2018 CT of the head with no acute intracranial abnormality  FAMILY  - Updates: 02/11/2018 no family at bedside.  Family is aware that should she arrest she is a DNR no chest compressions no ACLS no intubation. App cct 45 min   Richardson Landry  Vanda Waskey ACNP Maryanna Shape PCCM Pager [phone] till 1 pm If no answer page [phone] 02/11/2018, 9:12 AM

## 2018-02-11 NOTE — Progress Notes (Signed)
Pharmacy Antibiotic Note  Nicole Hutchinson is a 82 y.o. female admitted on 02/28/2018 with cellulitis.  Pharmacy has been consulted for Vancomycin and Zosyn dosing.   Today, 02/11/2018 Day #2 Vancomycin and Zosyn Still requiring vasopressors Afebrile WBC 41.2 SCr 1.07 > 1.39 > 1.58   Plan: Adjust Zosyn to 2.25g IV q8h Adjust Vancomycin to 500mg  IV q72h Check Vancomycin levels at steady state, goal AUC 400-500  Height: 4\' 6"  (137.2 cm) Weight: 127 lb 13.9 oz (58 kg) IBW/kg (Calculated) : 31.7  Temp (24hrs), Avg:98.2 F (36.8 C), Min:97.5 F (36.4 C), Max:98.8 F (37.1 C)  Recent Labs  Lab 02/28/2018 1605 03/05/2018 2043 02/10/18 0200 02/10/18 0744 02/10/18 0810 02/10/18 1740 02/11/18 0104 02/11/18 0625  WBC 41.8*  --  39.3* 56.2*  --  48.2* 41.2*  --   CREATININE 1.01*  --  1.07*  --   --  1.39*  --  1.58*  LATICACIDVEN  --  2.06*  --   --  2.2*  --   --   --     Estimated Creatinine Clearance: 18.9 mL/min (A) (by C-G formula based on SCr of 1.58 mg/dL (H)).    Allergies  Allergen Reactions  . Ace Inhibitors     REACTION: cough  . Codeine     Antimicrobials this admission: Vancomycin 02/21/2018 >> Zosyn 02/10/2018 >>   Dose adjustments this admission: -  Microbiology results: 5/7 BCx: ngtd 5/8 MRSA PCR: negative  Thank you for allowing pharmacy to be a part of this patient's care.  Peggyann Juba, PharmD, BCPS Pager: [phone] 02/11/2018 9:11 AM

## 2018-02-11 NOTE — Progress Notes (Signed)
Pharmacy - Brief Note  See Pharmacist's note from earlier today regarding antibiotic monitoring  Patient SCr trending up on vancomycin/zosyn (increase nephrotoxicity with combo)  Plan  Discuss with PCCM, change to cefepime 1gm IV q24h  Follow-up ability to narrow, may not need pseudomonas coverage  Doreene Eland, PharmD, BCPS.   Pager: [phone] 02/11/2018 1:53 PM

## 2018-02-12 ENCOUNTER — Inpatient Hospital Stay (HOSPITAL_COMMUNITY): Payer: Medicare PPO

## 2018-02-12 DIAGNOSIS — R579 Shock, unspecified: Secondary | ICD-10-CM

## 2018-02-12 LAB — PROTEIN, PLEURAL OR PERITONEAL FLUID

## 2018-02-12 LAB — BASIC METABOLIC PANEL
ANION GAP: 13 (ref 5–15)
ANION GAP: 12 (ref 5–15)
Anion gap: 14 (ref 5–15)
Anion gap: 14 (ref 5–15)
BUN: 45 mg/dL — AB (ref 6–20)
BUN: 45 mg/dL — ABNORMAL HIGH (ref 6–20)
BUN: 44 mg/dL — ABNORMAL HIGH (ref 6–20)
BUN: 48 mg/dL — ABNORMAL HIGH (ref 6–20)
CALCIUM: 6.5 mg/dL — AB (ref 8.9–10.3)
CALCIUM: 6.4 mg/dL — AB (ref 8.9–10.3)
CHLORIDE: 96 mmol/L — AB (ref 101–111)
CHLORIDE: 94 mmol/L — AB (ref 101–111)
CO2: 22 mmol/L (ref 22–32)
CO2: 19 mmol/L — AB (ref 22–32)
CO2: 18 mmol/L — AB (ref 22–32)
CO2: 21 mmol/L — AB (ref 22–32)
CREATININE: 1.96 mg/dL — AB (ref 0.44–1.00)
CREATININE: 2 mg/dL — AB (ref 0.44–1.00)
Calcium: 7.1 mg/dL — ABNORMAL LOW (ref 8.9–10.3)
Calcium: 6.8 mg/dL — ABNORMAL LOW (ref 8.9–10.3)
Chloride: 94 mmol/L — ABNORMAL LOW (ref 101–111)
Chloride: 98 mmol/L — ABNORMAL LOW (ref 101–111)
Creatinine, Ser: 2.04 mg/dL — ABNORMAL HIGH (ref 0.44–1.00)
Creatinine, Ser: 2.36 mg/dL — ABNORMAL HIGH (ref 0.44–1.00)
GFR calc Af Amer: 27 mL/min — ABNORMAL LOW (ref >60)
GFR calc Af Amer: 26 mL/min — ABNORMAL LOW (ref >60)
GFR calc Af Amer: 25 mL/min — ABNORMAL LOW (ref >60)
GFR calc Af Amer: 21 mL/min — ABNORMAL LOW (ref >60)
GFR calc non Af Amer: 23 mL/min — ABNORMAL LOW (ref >60)
GFR calc non Af Amer: 22 mL/min — ABNORMAL LOW (ref >60)
GFR calc non Af Amer: 18 mL/min — ABNORMAL LOW (ref >60)
GFR, EST NON AFRICAN AMERICAN: 22 mL/min — AB (ref >60)
GLUCOSE: 426 mg/dL — AB (ref 65–99)
GLUCOSE: 226 mg/dL — AB (ref 65–99)
Glucose, Bld: 143 mg/dL — ABNORMAL HIGH (ref 65–99)
Glucose, Bld: 399 mg/dL — ABNORMAL HIGH (ref 65–99)
POTASSIUM: 4.4 mmol/L (ref 3.5–5.1)
POTASSIUM: 4.2 mmol/L (ref 3.5–5.1)
POTASSIUM: 4.8 mmol/L (ref 3.5–5.1)
Potassium: 4.7 mmol/L (ref 3.5–5.1)
SODIUM: 126 mmol/L — AB (ref 135–145)
Sodium: 132 mmol/L — ABNORMAL LOW (ref 135–145)
Sodium: 126 mmol/L — ABNORMAL LOW (ref 135–145)
Sodium: 131 mmol/L — ABNORMAL LOW (ref 135–145)

## 2018-02-12 LAB — BPAM RBC
BLOOD PRODUCT EXPIRATION DATE: 201906012359
ISSUE DATE / TIME: 201905081223
UNIT TYPE AND RH: 5100

## 2018-02-12 LAB — CBC WITH DIFFERENTIAL/PLATELET
Basophils Absolute: 0 10*3/uL (ref 0.0–0.1)
Basophils Relative: 0 %
EOS ABS: 0 10*3/uL (ref 0.0–0.7)
Eosinophils Relative: 0 %
HEMATOCRIT: 33.8 % — AB (ref 36.0–46.0)
Hemoglobin: 10.2 g/dL — ABNORMAL LOW (ref 12.0–15.0)
LYMPHS ABS: 1.2 10*3/uL (ref 0.7–4.0)
LYMPHS PCT: 3 %
MCH: 21.4 pg — ABNORMAL LOW (ref 26.0–34.0)
MCHC: 30.2 g/dL (ref 30.0–36.0)
MCV: 71 fL — AB (ref 78.0–100.0)
Monocytes Absolute: 2.5 10*3/uL — ABNORMAL HIGH (ref 0.1–1.0)
Monocytes Relative: 7 %
Neutro Abs: 33.2 10*3/uL — ABNORMAL HIGH (ref 1.7–7.7)
Neutrophils Relative %: 90 %
PLATELETS: 174 10*3/uL (ref 150–400)
RBC: 4.76 MIL/uL (ref 3.87–5.11)
RDW: 23.6 % — AB (ref 11.5–15.5)
WBC: 36.9 10*3/uL — AB (ref 4.0–10.5)

## 2018-02-12 LAB — PROCALCITONIN: Procalcitonin: 3.65 ng/mL

## 2018-02-12 LAB — TYPE AND SCREEN
ABO/RH(D): O POS
ABO/RH(D): O POS
ANTIBODY SCREEN: NEGATIVE
ANTIBODY SCREEN: NEGATIVE
Unit division: 0

## 2018-02-12 LAB — BODY FLUID CELL COUNT WITH DIFFERENTIAL
Eos, Fluid: 0 %
LYMPHS FL: 10 %
MONOCYTE-MACROPHAGE-SEROUS FLUID: 14 % — AB (ref 50–90)
NEUTROPHIL FLUID: 76 % — AB (ref 0–25)
WBC FLUID: 319 uL (ref 0–1000)

## 2018-02-12 LAB — LACTATE DEHYDROGENASE: LDH: 196 U/L — AB (ref 98–192)

## 2018-02-12 LAB — PHOSPHORUS: Phosphorus: 6.1 mg/dL — ABNORMAL HIGH (ref 2.5–4.6)

## 2018-02-12 LAB — APTT: aPTT: 56 seconds — ABNORMAL HIGH (ref 24–36)

## 2018-02-12 LAB — D-DIMER, QUANTITATIVE: D-Dimer, Quant: 0.37 ug/mL-FEU (ref 0.00–0.50)

## 2018-02-12 LAB — GLUCOSE, CAPILLARY
GLUCOSE-CAPILLARY: 163 mg/dL — AB (ref 65–99)
Glucose-Capillary: 186 mg/dL — ABNORMAL HIGH (ref 65–99)
Glucose-Capillary: 208 mg/dL — ABNORMAL HIGH (ref 65–99)

## 2018-02-12 LAB — LACTIC ACID, PLASMA: Lactic Acid, Venous: 2 mmol/L (ref 0.5–1.9)

## 2018-02-12 LAB — MAGNESIUM: MAGNESIUM: 1.9 mg/dL (ref 1.7–2.4)

## 2018-02-12 LAB — LACTATE DEHYDROGENASE, PLEURAL OR PERITONEAL FLUID: LD, Fluid: 71 U/L — ABNORMAL HIGH (ref 3–23)

## 2018-02-12 LAB — PROTEIN, TOTAL: Total Protein: 4.5 g/dL — ABNORMAL LOW (ref 6.5–8.1)

## 2018-02-12 LAB — HEPARIN LEVEL (UNFRACTIONATED)

## 2018-02-12 MED ORDER — FUROSEMIDE 10 MG/ML IJ SOLN
4.0000 mg/h | INTRAVENOUS | Status: DC
  Administered 2018-02-12 – 2018-02-14 (×2): 4 mg/h via INTRAVENOUS
  Filled 2018-02-12 (×2): qty 25

## 2018-02-12 MED ORDER — HEPARIN (PORCINE) IN NACL 100-0.45 UNIT/ML-% IJ SOLN
750.0000 [IU]/h | INTRAMUSCULAR | Status: DC
  Administered 2018-02-12: 750 [IU]/h via INTRAVENOUS
  Filled 2018-02-12: qty 250

## 2018-02-12 MED ORDER — SODIUM CHLORIDE 0.9 % IV SOLN
1.0000 g | INTRAVENOUS | Status: DC
  Administered 2018-02-12 – 2018-02-14 (×3): 1 g via INTRAVENOUS
  Filled 2018-02-12 (×3): qty 1

## 2018-02-12 MED ORDER — HEPARIN BOLUS VIA INFUSION
2000.0000 [IU] | Freq: Once | INTRAVENOUS | Status: AC
  Administered 2018-02-12: 2000 [IU] via INTRAVENOUS
  Filled 2018-02-12: qty 2000

## 2018-02-12 MED ORDER — SODIUM CHLORIDE 0.9 % IV BOLUS
1000.0000 mL | Freq: Once | INTRAVENOUS | Status: AC
  Administered 2018-02-12: 1000 mL via INTRAVENOUS

## 2018-02-12 MED ORDER — NOREPINEPHRINE 4 MG/250ML-% IV SOLN
0.0000 ug/min | INTRAVENOUS | Status: DC
Start: 2018-02-12 — End: 2018-02-13
  Administered 2018-02-12: 29 ug/min via INTRAVENOUS
  Administered 2018-02-12: 26 ug/min via INTRAVENOUS
  Administered 2018-02-13: 25 ug/min via INTRAVENOUS
  Administered 2018-02-13: 24 ug/min via INTRAVENOUS
  Administered 2018-02-13: 25 ug/min via INTRAVENOUS
  Administered 2018-02-13: 23 ug/min via INTRAVENOUS
  Administered 2018-02-13: 25 ug/min via INTRAVENOUS
  Administered 2018-02-13: 23 ug/min via INTRAVENOUS
  Filled 2018-02-12 (×9): qty 250

## 2018-02-12 MED ORDER — INSULIN ASPART 100 UNIT/ML ~~LOC~~ SOLN
0.0000 [IU] | SUBCUTANEOUS | Status: DC
  Administered 2018-02-12 – 2018-02-13 (×7): 3 [IU] via SUBCUTANEOUS
  Administered 2018-02-13: 5 [IU] via SUBCUTANEOUS
  Administered 2018-02-13 – 2018-02-14 (×2): 3 [IU] via SUBCUTANEOUS
  Administered 2018-02-14: 2 [IU] via SUBCUTANEOUS

## 2018-02-12 NOTE — Consult Note (Signed)
SLP Cancellation Note  Patient Details Name: LEANA SPRINGSTON MRN: [id number] DOB: 08/27/1936   Cancelled treatment:        Pt was scheduled for MBS today, however, pt is now requiring BiPAP. Will cancel MBS for today and continue to follow to assess appropriateness. RN aware.  Rael Tilly B. Quentin Ore [facility], CCC-SLP Speech Language Pathologist [phone]  Shonna Chock 02/12/2018, 1:23 PM

## 2018-02-12 NOTE — Procedures (Signed)
Chest Tube Insertion Procedure Note  Indications:  Clinically significant Pneumothorax  Pre-operative Diagnosis: Pneumothorax  Post-operative Diagnosis: Pneumothorax  Procedure Details  Informed consent was obtained for the procedure, including sedation.  Risks of lung perforation, hemorrhage, arrhythmia, and adverse drug reaction were discussed.   After sterile skin prep, using standard technique, a 14 French tube was placed in the right lateral 6th rib space.  Findings: None  Estimated Blood Loss:  Minimal         Specimens:  None              Complications:  none         Disposition: ICU.           Condition: stable   Procedure performed under direct supervision of Dr. Lamonte Sakai.    Noe Gens, NP-C [facility] Pgr: [phone] or if no answer [phone] 02/12/2018, 3:39 PM

## 2018-02-12 NOTE — Procedures (Addendum)
Thoracentesis Procedure Note  Pre-operative Diagnosis: Right Pleural Effusion   Post-operative Diagnosis: same  Indications: Diagnostic and therapeutic relief of dyspnea  Procedure Details  Consent: Informed consent was obtained. Risks of the procedure were discussed including: infection, bleeding, pain, pneumothorax.  Under sterile conditions the patient was positioned. Betadine solution and sterile drapes were utilized.  1% plain lidocaine was used to anesthetize the 8th rib space. Fluid was obtained without any difficulties and minimal blood loss.  A dressing was applied to the wound and wound care instructions were provided.   Findings 1100 ml of cloudy yellow pleural fluid was obtained. A sample was sent for cell counts, as well as for infection analysis.  Complications:  None; patient tolerated the procedure well.          Condition: stable  Plan Follow up CXR now  Supportive care  Follow pleural fluid    Procedure performed under direct supervision of Dr. Lamonte Sakai and with ultrasound guidance.      Noe Gens, NP-C [facility] Pgr: [phone] or if no answer [phone] 02/12/2018, 12:43 PM    Addendum:  CXR reviewed, concern for ex-vacuo pneumothorax but she does have a small amount of subcutaneous air at the thora site and right upper neck.  ? If the track did not close given her anansarca?? No returned air during procedure. Reviewed with family.  Will place small bore chest tube.

## 2018-02-12 NOTE — Progress Notes (Signed)
CRITICAL VALUE ALERT  Critical Value:  Calcium  6.4   Date & Time Notied:  02/12/18 1443  Provider Notified: Noe Gens NP   Orders Received/Actions taken: None at this time. Will pass on to bedside RN Pamala Hurry.

## 2018-02-12 NOTE — Progress Notes (Signed)
RN notified pharmacy that Heparin gtt is being initiated.

## 2018-02-12 NOTE — Progress Notes (Signed)
RN notified that patients cxy shows a pneumothorax on the right side.  NP Noe Gens updated on patients change in condition.

## 2018-02-12 NOTE — Progress Notes (Signed)
   Pt. bp at 6am was 92/50 (63), Bp dropped to 74/43 (53) at 6:56am  Then 54/18 (21). Pressors were aggressively titrated to maintain a map of 65 and SBP >90. Patient was not given any medications that could lower bp. BP at 8am was 118/46 (68).

## 2018-02-12 NOTE — Progress Notes (Addendum)
RN paged Dr. Lamonte Sakai for the go ahead to start the heparin gtt for patient now that all procedures have been completed, RN directed to start heparin gtt.  Will continue to follow up.

## 2018-02-12 NOTE — Progress Notes (Addendum)
PULMONARY / CRITICAL CARE MEDICINE   Name: Nicole Hutchinson MRN: [id number] DOB: 07/28/1936    ADMISSION DATE:  02/13/2018 CONSULTATION DATE:  02/10/18  REFERRING MD:  Dr. Karleen Hampshire   CHIEF COMPLAINT:  SOB, LE Swelling  HISTORY OF PRESENT ILLNESS:   82 y/o F, never smoker, who presented to [facility] on 5/7 with complaints of shortness of breath.   The patient's sister called her Cardiologist on 5/6 with c/o SOB and LE swelling.  She recently was started on lasix by her PCP.  She has chronic LE wounds and is followed by wound care.  Per notes, recently had BLE dopplers that were negative. Acute symptoms began around May 1st. She lives with her sister who reported she was more sleepy and had difficulty lying flat.  Her pastor noted she had been sleeping during church for the last two months.  She is on pradaxa for AF.  Sister called EMS 5/7 for increasing SOB.  EMS found her to be hypoxic with sats in the 80's on arrival.  She was admitted per [facility] to further evaluate hypoxic respiratory failure.  Work up notable for large right effusion on CXR, leukocytosis, FOBT positive and right thigh cellulitis.  UA negative.  Cultures pending.  The patient developed hypotension overnight on 5/8 and was transferred to ICU.  She required vasopressors for MAP.  She had altered mental status.  ABG 7.326 / 56.8 / 71 / 28.    PCCM consulted for pleural effusion and hypotension.  SUBJECTIVE:  Sisters at bedside.  RN reports increasing need for levophed overnight, had to go back on BiPAP, 50% O2.    VITAL SIGNS: BP 123/61   Pulse (!) 102   Temp (!) 97.4 F (36.3 C) (Axillary)   Resp (!) 23   Ht 4\' 6"  (1.372 m)   Wt 127 lb 13.9 oz (58 kg)   SpO2 95%   BMI 30.83 kg/m   HEMODYNAMICS:    VENTILATOR SETTINGS:    INTAKE / OUTPUT: I/O last 3 completed shifts: In: 1512 [I.V.:1112; IV Piggyback:400] Out: 350 [Urine:350]  PHYSICAL EXAMINATION: General: frail elderly female in NAD on BiPAP HEENT: MM pink/moist,  BiPAP mask in place  Neuro: opens eyes, no follow commands, spontaneous movement  CV: s1s2 rrr, no m/r/g PULM: even/non-labored, lungs bilaterally diminished  NW:GNFA, non-tender, bsx4 active  Extremities: warm/dry, 2-3+ generalized pitting edema  Skin: no rashes or lesions  LABS:  BMET Recent Labs  Lab 02/10/18 1740 02/11/18 0625 02/12/18 0133  NA 133* 131* 132*  K 4.3 4.4 4.7  CL 98* 97* 96*  CO2 25 23 22   BUN 29* 35* 45*  CREATININE 1.39* 1.58* 1.96*  GLUCOSE 100* 136* 143*    Electrolytes Recent Labs  Lab 02/10/18 1740 02/11/18 0625 02/12/18 0133  CALCIUM 7.5* 7.5* 7.1*  MG  --   --  1.9  PHOS  --   --  6.1*    CBC Recent Labs  Lab 02/11/18 0104 02/11/18 1455 02/12/18 0133  WBC 41.2* 33.4* 36.9*  HGB 10.1* 9.6* 10.2*  HCT 33.3* 31.3* 33.8*  PLT 164 142* 174    Coag's No results for input(s): APTT, INR in the last 168 hours.  Sepsis Markers Recent Labs  Lab 02/17/2018 2043 02/10/18 0810 02/11/18 1025 02/12/18 0133  LATICACIDVEN 2.06* 2.2* 1.1  --   PROCALCITON  --   --  3.84 3.65    ABG Recent Labs  Lab 03/01/2018 1630 02/10/18 0448 02/10/18 0930  PHART 7.343* 7.326*  7.307*  PCO2ART 55.6* 56.8* 57.9*  PO2ART 127* 71.8* 72.6*    Liver Enzymes Recent Labs  Lab 02/27/2018 1605 02/10/18 0200 02/11/18 0625  AST 19 21 27   ALT 15 14 21   ALKPHOS 78 69 73  BILITOT 0.5 0.7 0.6  ALBUMIN 2.9* 2.5* 2.2*    Cardiac Enzymes Recent Labs  Lab 02/11/18 0104 02/11/18 0625 02/11/18 1025  TROPONINI 0.85* 0.71* 0.74*    Glucose Recent Labs  Lab 02/10/18 0551 02/10/18 1303  GLUCAP 71 85    Imaging No results found.   STUDIES:  Super D CT 03/05/17 >> near complete interval clearing of LLL lung nodule compatible with an inflammatory or infectious process, similar appearance of partially loculated small to moderate right pleural effusion with associated pleural thickening, trace left pleural fluid, moderate pericardial effusion, CAD,  gallstones ECHO 5/8 >> EF 65-70%, small to moderate pericardial effusion, cannot rule out tamponade physiology, PA peak 50 CT Head 5/8 >> no acute intracranial abnormality CT Leg 5/8 >>  LE Doppler 5/10 >>   CULTURES: UA 5/7 >> negative BCx2 5/7 >>   ANTIBIOTICS: Vanco 5/7 >> 5/10 Zosyn 5/8 >> 5/9 Cefepime 5/9 >> Ceftriaxone 5/10 >>   SIGNIFICANT EVENTS: 5/07  Admit with SOB, BLE  5/09 Off BiPAP / awake.  Levophed is weaning currently on 4 mics 5/10 Thora   LINES/TUBES: LUE PICC 5/8 >>   DISCUSSION: 82 y/o F who presented to [facility] on 5/7 with reports of shortness of breath.    ASSESSMENT / PLAN:  PULMONARY A: Bilateral R>L Pleural Effusions - note pleural thickening lends to chronicity of effusions, might be difficult to do thora Pulmonary Hypertension - PAS 88mmHg seen on ECHO  Acute Respiratory Insufficiency  R/O DVT / PE P:   BiPAP PRN for increased WOB  Assess LE doppler for PE evaluation, d-dimer Can not do CTA given renal function  O2 as needed to support sats > 90% Follow CXR  Thora 5/10 Pulmonary hygiene as able   CARDIOVASCULAR A:  Shock - initial concern for sepsis with right thigh cellulitis, rule out significant pericardial tamponade with ECHO. Hx of effusion on CT.  Suspect L/R CHF decompensation Moderate Pericardial Effusion - by CT on 5/8, ECHO  Pulmonary Hypertension Hx AF, HTN, HLD, Moderate MR P:  ICU monitoring  Continue levophed for MAP > 65 Stress dose steroids Begin low dose lasix gtt with BMP Q8 PE eval as above  Previously on Pradaxa for AF DNR / DNI  Begin heparin gtt as above for hx of AF, rule out PE  RENAL A:   Hyponatremia  P:   Trend BMP / urinary output Replace electrolytes as indicated Avoid nephrotoxic agents, ensure adequate renal perfusion  GASTROINTESTINAL A:   Hx Colon Cancer s/p Resection - FOBT positive Melena - no active bleeding  P:   NPO   HEMATOLOGIC A:   Leukocytosis - significant, up to 56k Anemia   Prior Anticoagulation  P:  Trend CBC  Prior ANA positive as outpatient with 1:64 ratio Narrow abx  INFECTIOUS A:   R Thigh Cellulitis - doubt is primary source of hypotension as her leg exam seems out of proportion to her shock.  P:   ABX as above  Follow cultures   ENDOCRINE A:   Hyperthyroidism - TSH 1.579 5/8 P:   Trend CBC Continue synthroid when able to take PO's  NEUROLOGIC A:   Acute Metabolic Encephalopathy - unclear etiology, CT head negative  Hx CVA P:  Supportive care Frequent neuro exams PT efforts when able   FAMILY  - Updates: 02/11/2018 no family at bedside.  Family is aware that should she arrest she is a DNR no chest compressions no ACLS no intubation.  CC Time: 30 minutes   Noe Gens, NP-C [facility] Pgr: [phone] or if no answer [phone] 02/12/2018, 8:43 AM

## 2018-02-12 NOTE — Progress Notes (Addendum)
ANTICOAGULATION CONSULT NOTE - Initial Consult  Pharmacy Consult for Heparin Indication: atrial fibrillation and pulmonary embolus  Allergies  Allergen Reactions  . Ace Inhibitors     REACTION: cough  . Codeine     Patient Measurements: Height: 4\' 6"  (137.2 cm) Weight: 127 lb 13.9 oz (58 kg) IBW/kg (Calculated) : 31.7 Heparin Dosing Weight: 45.1  Vital Signs: Temp: 97.4 F (36.3 C) (05/10 0758) Temp Source: Axillary (05/10 0758) BP: 121/60 (05/10 1003) Pulse Rate: 94 (05/10 0950)  Labs: Recent Labs    02/10/18 1740 02/11/18 0104 02/11/18 0625 02/11/18 1025 02/11/18 1455 02/12/18 0133  HGB 10.0* 10.1*  --   --  9.6* 10.2*  HCT 33.8* 33.3*  --   --  31.3* 33.8*  PLT 175 164  --   --  142* 174  CREATININE 1.39*  --  1.58*  --   --  1.96*  TROPONINI 0.80* 0.85* 0.71* 0.74*  --   --     Estimated Creatinine Clearance: 15.3 mL/min (A) (by C-G formula based on SCr of 1.96 mg/dL (H)).   Medical History: Past Medical History:  Diagnosis Date  . Atrial fibrillation ([place]) 2008   On Pradaxa  . Dyslipidemia   . HTN (hypertension)   . Hyperglycemia   . Hyperthyroidism   . Moderate mitral regurgitation by prior echocardiogram    Normal left ventricular function left atrial enlargement echo 2010  . Osteoarthritis   . Osteoporosis   . Stroke ([place])   . Tubulovillous adenoma of colon 2002    Medications:  Scheduled:  . Chlorhexidine Gluconate Cloth  6 each Topical Q0600  . collagenase   Topical Daily  . hydrocortisone sod succinate (SOLU-CORTEF) inj  50 mg Intravenous Q6H  . latanoprost  1 drop Both Eyes QHS  . sodium chloride flush  10-40 mL Intracatheter Q12H   Infusions:  . sodium chloride 75 mL/hr at 02/12/18 0154  . ceFEPime (MAXIPIME) IV Stopped (02/11/18 2330)  . famotidine (PEPCID) IV Stopped (02/11/18 2330)  . norepinephrine (LEVOPHED) Adult infusion 23 mcg/min (02/12/18 1018)  . phenylephrine (NEO-SYNEPHRINE) Adult infusion Stopped (02/12/18 0828)  .  vancomycin      Assessment: 64 yoF admitted 5/7 with SOB related to recurrent pleural effusions and sepsis related to thigh abscess.  PMH is significant for chronic afib on Pradaxa anticoagulation.  PTA Pradaxa initially continued on admission.  Most recent dose given on 5/8 at 02:00.  Now on hold for possible GI bleeding; using SCDs alone for VTE prophylaxis.  5/8 GI eval: heme + stool and new microcytic anemia on Pradaxa. No overt GI bleed.  Will need workup microcytic anemia when acute medical issues resolve. Transfuse PRBC, IV famotidine.  5/10:  PCCM concerned for increased WOB.  R/o DVT/PE.  Unable to do CTa given worsening renal function.  Pharmacy is consulted to dose Heparin for Afib and r/o VTE.  Begin after thoracentesis.  Today, 02/12/2018:  Baseline coags pending (> 48 hours since Pradaxa dose)  CBC: Hgb 10.2 (improved s/p PRBC transfusion on 5/8), Plt 174  No bleeding or complications reported.  Discussed possible GI bleeding with PCCM and GI service on 5/10.  No known active bleeding.  OK to start heparin and monitor.  Continue IV famotidine.  SCr increased to 1.96   Goal of Therapy:  Heparin level 0.3-0.7 units/ml Monitor platelets by anticoagulation protocol: Yes   Plan:   Start Heparin infusion AFTER thoracentesis, a-line placement, and xray on 5/10.  Give heparin 2000 units bolus  IV x 1  Start heparin IV infusion at 750 units/hr  Heparin level 8 hours after starting  Daily heparin level and CBC  Continue to monitor H&H and platelets, s/s GI bleeding   Gretta Arab PharmD, BCPS Pager [phone] 02/12/2018 10:58 AM    Addendum: RN to start heparin infusion after chest tube placed (5/10 at ~ 15:00) and given all-clear to start anticoagulation by PCCM.  RN will call pharmacy this afternoon when starting heparin to re-time first levels. Plan :  Give heparin 2000 units bolus IV x 1  Start heparin IV infusion at 750 units/hr  Heparin level 8 hours  after starting  Gretta Arab PharmD, BCPS Pager [phone] 02/12/2018 3:05 PM

## 2018-02-12 NOTE — Consult Note (Signed)
SLP Note  Patient Details Name: Nicole Hutchinson MRN: [id number] DOB: 03/31/1936  Pt is scheduled to undergo a MBS today to assess swallow function and safety, and identify least restrictive diet. It will likely be this afternoon per radiology. Please page if you have questions.  Celia B. Quentin Ore [facility], CCC-SLP Speech Language Pathologist [phone]                                                                                    Shonna Chock 02/12/2018, 8:56 AM

## 2018-02-12 NOTE — Progress Notes (Signed)
RN notified that cxy following chest tube insertions shows a resolution of the pneumothorax and currently a plueral effusion.  Also RN was told that it appears that the chest tube may be kinked.  RN updated Noe Gens NP on results, received no new orders at this time.

## 2018-02-13 ENCOUNTER — Inpatient Hospital Stay (HOSPITAL_COMMUNITY): Payer: Medicare PPO

## 2018-02-13 DIAGNOSIS — I481 Persistent atrial fibrillation: Secondary | ICD-10-CM

## 2018-02-13 DIAGNOSIS — M7989 Other specified soft tissue disorders: Secondary | ICD-10-CM

## 2018-02-13 LAB — BASIC METABOLIC PANEL
ANION GAP: 12 (ref 5–15)
ANION GAP: 10 (ref 5–15)
Anion gap: 16 — ABNORMAL HIGH (ref 5–15)
BUN: 51 mg/dL — ABNORMAL HIGH (ref 6–20)
BUN: 51 mg/dL — ABNORMAL HIGH (ref 6–20)
BUN: 51 mg/dL — ABNORMAL HIGH (ref 6–20)
CHLORIDE: 95 mmol/L — AB (ref 101–111)
CHLORIDE: 97 mmol/L — AB (ref 101–111)
CHLORIDE: 93 mmol/L — AB (ref 101–111)
CO2: 21 mmol/L — ABNORMAL LOW (ref 22–32)
CO2: 21 mmol/L — AB (ref 22–32)
CO2: 21 mmol/L — AB (ref 22–32)
Calcium: 6.7 mg/dL — ABNORMAL LOW (ref 8.9–10.3)
Calcium: 6.9 mg/dL — ABNORMAL LOW (ref 8.9–10.3)
Calcium: 6.4 mg/dL — CL (ref 8.9–10.3)
Creatinine, Ser: 2.45 mg/dL — ABNORMAL HIGH (ref 0.44–1.00)
Creatinine, Ser: 2.49 mg/dL — ABNORMAL HIGH (ref 0.44–1.00)
Creatinine, Ser: 2.48 mg/dL — ABNORMAL HIGH (ref 0.44–1.00)
GFR calc Af Amer: 20 mL/min — ABNORMAL LOW (ref >60)
GFR calc non Af Amer: 18 mL/min — ABNORMAL LOW (ref >60)
GFR calc non Af Amer: 17 mL/min — ABNORMAL LOW (ref >60)
GFR calc non Af Amer: 17 mL/min — ABNORMAL LOW (ref >60)
GFR, EST AFRICAN AMERICAN: 20 mL/min — AB (ref >60)
GFR, EST AFRICAN AMERICAN: 20 mL/min — AB (ref >60)
Glucose, Bld: 245 mg/dL — ABNORMAL HIGH (ref 65–99)
Glucose, Bld: 196 mg/dL — ABNORMAL HIGH (ref 65–99)
Glucose, Bld: 186 mg/dL — ABNORMAL HIGH (ref 65–99)
POTASSIUM: 4.1 mmol/L (ref 3.5–5.1)
Potassium: 4.2 mmol/L (ref 3.5–5.1)
Potassium: 4.3 mmol/L (ref 3.5–5.1)
SODIUM: 128 mmol/L — AB (ref 135–145)
Sodium: 128 mmol/L — ABNORMAL LOW (ref 135–145)
Sodium: 130 mmol/L — ABNORMAL LOW (ref 135–145)

## 2018-02-13 LAB — GLUCOSE, CAPILLARY
GLUCOSE-CAPILLARY: 191 mg/dL — AB (ref 65–99)
GLUCOSE-CAPILLARY: 198 mg/dL — AB (ref 65–99)
Glucose-Capillary: 184 mg/dL — ABNORMAL HIGH (ref 65–99)
Glucose-Capillary: 196 mg/dL — ABNORMAL HIGH (ref 65–99)
Glucose-Capillary: 175 mg/dL — ABNORMAL HIGH (ref 65–99)
Glucose-Capillary: 153 mg/dL — ABNORMAL HIGH (ref 65–99)

## 2018-02-13 LAB — CBC
HEMATOCRIT: 33.2 % — AB (ref 36.0–46.0)
HEMOGLOBIN: 10 g/dL — AB (ref 12.0–15.0)
MCH: 21.2 pg — AB (ref 26.0–34.0)
MCHC: 30.1 g/dL (ref 30.0–36.0)
MCV: 70.5 fL — ABNORMAL LOW (ref 78.0–100.0)
Platelets: 146 10*3/uL — ABNORMAL LOW (ref 150–400)
RBC: 4.71 MIL/uL (ref 3.87–5.11)
RDW: 23.9 % — AB (ref 11.5–15.5)
WBC: 30 10*3/uL — ABNORMAL HIGH (ref 4.0–10.5)

## 2018-02-13 LAB — HEPARIN LEVEL (UNFRACTIONATED)
HEPARIN UNFRACTIONATED: 0.59 [IU]/mL (ref 0.30–0.70)
HEPARIN UNFRACTIONATED: 0.17 [IU]/mL — AB (ref 0.30–0.70)
Heparin Unfractionated: 0.29 IU/mL — ABNORMAL LOW (ref 0.30–0.70)

## 2018-02-13 LAB — PROCALCITONIN: Procalcitonin: 4.31 ng/mL

## 2018-02-13 MED ORDER — HEPARIN (PORCINE) IN NACL 100-0.45 UNIT/ML-% IJ SOLN
900.0000 [IU]/h | INTRAMUSCULAR | Status: DC
  Administered 2018-02-14: 1000 [IU]/h via INTRAVENOUS
  Administered 2018-02-15: 900 [IU]/h via INTRAVENOUS
  Filled 2018-02-13 (×2): qty 250

## 2018-02-13 MED ORDER — DEXTROSE 5 % IV SOLN
0.0000 ug/min | INTRAVENOUS | Status: DC
  Administered 2018-02-13: 24 ug/min via INTRAVENOUS
  Administered 2018-02-13: 15 ug/min via INTRAVENOUS
  Administered 2018-02-14: 21 ug/min via INTRAVENOUS
  Administered 2018-02-14: 25 ug/min via INTRAVENOUS
  Filled 2018-02-13 (×5): qty 4

## 2018-02-13 MED ORDER — LEVOTHYROXINE SODIUM 100 MCG IV SOLR
12.5000 ug | Freq: Every day | INTRAVENOUS | Status: DC
  Administered 2018-02-13 – 2018-02-14 (×2): 12.5 ug via INTRAVENOUS
  Filled 2018-02-13 (×2): qty 5

## 2018-02-13 MED ORDER — SODIUM CHLORIDE 0.9 % IV SOLN
2.0000 g | Freq: Once | INTRAVENOUS | Status: AC
  Administered 2018-02-14: 2 g via INTRAVENOUS
  Filled 2018-02-13: qty 20

## 2018-02-13 MED ORDER — LIP MEDEX EX OINT
TOPICAL_OINTMENT | CUTANEOUS | Status: AC
  Administered 2018-02-13: 1
  Filled 2018-02-13: qty 7

## 2018-02-13 NOTE — Progress Notes (Signed)
*  Preliminary Results* Bilateral lower extremity venous duplex completed. No obvious evidence of acute deep vein thrombosis involving bilateral lower extremities. There is no evidence of Baker's cyst bilaterally.  02/13/2018 9:24 AM Maudry Mayhew, BS, RVT, RDCS, RDMS

## 2018-02-13 NOTE — Progress Notes (Signed)
ANTICOAGULATION CONSULT NOTE - Follow Consult  Pharmacy Consult for Heparin Indication: atrial fibrillation  Allergies  Allergen Reactions  . Ace Inhibitors     REACTION: cough  . Codeine     Patient Measurements: Height: 4\' 6"  (137.2 cm) Weight: 127 lb 13.9 oz (58 kg) IBW/kg (Calculated) : 31.7 Heparin Dosing Weight: 45.1  Vital Signs: Temp: 97.4 F (36.3 C) (05/11 0730) Temp Source: Axillary (05/11 0730) BP: 78/61 (05/11 1300) Pulse Rate: 95 (05/11 0310)  Labs: Recent Labs    02/11/18 0104 02/11/18 0625 02/11/18 1025 02/11/18 1455 02/12/18 0133  02/12/18 1330  02/13/18 0201 02/13/18 0528 02/13/18 1249  HGB 10.1*  --   --  9.6* 10.2*  --   --   --  10.0*  --   --   HCT 33.3*  --   --  31.3* 33.8*  --   --   --  33.2*  --   --   PLT 164  --   --  142* 174  --   --   --  146*  --   --   APTT  --   --   --   --   --   --  56*  --   --   --   --   HEPARINUNFRC  --   --   --   --   --   --  <0.10*  --  0.59  --  0.17*  CREATININE  --  1.58*  --   --  1.96*   < > 2.04*   < > QUESTIONABLE RESULTS, RECOMMEND RECOLLECT TO VERIFY 2.45* 2.49*  TROPONINI 0.85* 0.71* 0.74*  --   --   --   --   --   --   --   --    < > = values in this interval not displayed.    Estimated Creatinine Clearance: 12 mL/min (A) (by C-G formula based on SCr of 2.49 mg/dL (H)).   Medical History: Past Medical History:  Diagnosis Date  . Atrial fibrillation ([place]) 2008   On Pradaxa  . Dyslipidemia   . HTN (hypertension)   . Hyperglycemia   . Hyperthyroidism   . Moderate mitral regurgitation by prior echocardiogram    Normal left ventricular function left atrial enlargement echo 2010  . Osteoarthritis   . Osteoporosis   . Stroke ([place])   . Tubulovillous adenoma of colon 2002    Medications:  Scheduled:  . Chlorhexidine Gluconate Cloth  6 each Topical Q0600  . collagenase   Topical Daily  . hydrocortisone sod succinate (SOLU-CORTEF) inj  50 mg Intravenous Q6H  . insulin aspart  0-15  Units Subcutaneous Q4H  . latanoprost  1 drop Both Eyes QHS  . levothyroxine  12.5 mcg Intravenous Daily  . sodium chloride flush  10-40 mL Intracatheter Q12H   Infusions:  . sodium chloride 10 mL/hr at 02/12/18 1547  . cefTRIAXone (ROCEPHIN)  IV Stopped (02/12/18 2304)  . famotidine (PEPCID) IV Stopped (02/12/18 2157)  . furosemide (LASIX) infusion 4 mg/hr (02/13/18 0600)  . heparin 750 Units/hr (02/13/18 1300)  . norepinephrine (LEVOPHED) Adult infusion 23 mcg/min (02/13/18 1255)    Assessment: 33 yoF admitted 5/7 with SOB related to recurrent pleural effusions and sepsis related to thigh abscess.  PMH is significant for chronic afib on Pradaxa anticoagulation.  PTA Pradaxa initially continued on admission.  Most recent dose given on 5/8 at 02:00.  Now on hold for possible GI bleeding;  using SCDs alone for VTE prophylaxis.  5/8 GI eval: heme + stool and new microcytic anemia on Pradaxa. No overt GI bleed.  Will need workup microcytic anemia when acute medical issues resolve. Transfuse PRBC, IV famotidine.  5/10:  PCCM concerned for increased WOB.  R/o DVT/PE.  Unable to do CTa given worsening renal function.  Pharmacy is consulted to dose Heparin for Afib and r/o VTE.  Begin after thoracentesis.  Today, 02/13/2018:  Heparin level 0.17, subtherapeutic  Previously therapeutic on currently heparin at 7.5 ml/hr  RN reports no IV line problems or pauses.  Infusing well via PICC line.  CBC: Hgb stable at 10 (improved s/p PRBC transfusion on 5/8), Plt down to 146  No bleeding or complications reported.  Discussed possible GI bleeding with PCCM and GI service on 5/10.  No known active bleeding.  OK to start heparin and monitor.  Continue IV famotidine.  SCr increasing to 2.49, CrCl ~ 12 ml/min   Goal of Therapy:  Heparin level 0.3-0.7 units/ml Monitor platelets by anticoagulation protocol: Yes   Plan:   Increase to heparin IV infusion at 900 units/hr  Heparin level 8 hours  after rate change  Daily heparin level and CBC  Continue to monitor H&H and platelets, s/s GI bleeding   Gretta Arab PharmD, BCPS Pager [phone] 02/13/2018 1:47 PM

## 2018-02-13 NOTE — Progress Notes (Signed)
eLink Physician-Brief Progress Note Patient Name: Nicole Hutchinson DOB: January 31, 1936 MRN: [id number]   Date of Service  02/13/2018  HPI/Events of Note  Hypocalcemia`  eICU Interventions  Calcium replaced     Intervention Category Intermediate Interventions: Electrolyte abnormality - evaluation and management  DETERDING,ELIZABETH 02/13/2018, 11:38 PM

## 2018-02-13 NOTE — Progress Notes (Signed)
ANTICOAGULATION CONSULT NOTE - Follow Up Consult  Pharmacy Consult for heparin Indication: atrial fibrillation and pulmonary embolus  Allergies  Allergen Reactions  . Ace Inhibitors     REACTION: cough  . Codeine     Patient Measurements: Height: 4\' 6"  (137.2 cm) Weight: 127 lb 13.9 oz (58 kg) IBW/kg (Calculated) : 31.7 Heparin Dosing Weight:   Vital Signs: Temp: 97.4 F (36.3 C) (05/11 0359) Temp Source: Axillary (05/11 0359) BP: 65/19 (05/11 0310) Pulse Rate: 95 (05/11 0310)  Labs: Recent Labs    02/11/18 0104 02/11/18 0625 02/11/18 1025 02/11/18 1455 02/12/18 0133  02/12/18 1330 02/12/18 2011 02/13/18 0201  HGB 10.1*  --   --  9.6* 10.2*  --   --   --  10.0*  HCT 33.3*  --   --  31.3* 33.8*  --   --   --  33.2*  PLT 164  --   --  142* 174  --   --   --  146*  APTT  --   --   --   --   --   --  56*  --   --   HEPARINUNFRC  --   --   --   --   --   --  <0.10*  --  0.59  CREATININE  --  1.58*  --   --  1.96*   < > 2.04* 2.36* QUESTIONABLE RESULTS, RECOMMEND RECOLLECT TO VERIFY  TROPONINI 0.85* 0.71* 0.74*  --   --   --   --   --   --    < > = values in this interval not displayed.    CrCl cannot be calculated (This lab value cannot be used to calculate CrCl because it is not a number: QUESTIONABLE RESULTS, RECOMMEND RECOLLECT TO VERIFY).   Medications:  Infusions:  . sodium chloride 10 mL/hr at 02/12/18 1547  . cefTRIAXone (ROCEPHIN)  IV Stopped (02/12/18 2304)  . famotidine (PEPCID) IV Stopped (02/12/18 2157)  . furosemide (LASIX) infusion 4 mg/hr (02/13/18 0200)  . heparin 750 Units/hr (02/13/18 0200)  . norepinephrine (LEVOPHED) Adult infusion 25 mcg/min (02/13/18 0409)    Assessment: Patient with heparin level at goal.  No heparin issues noted.  Goal of Therapy:  Heparin level 0.3-0.7 units/ml Monitor platelets by anticoagulation protocol: Yes   Plan:  Continue heparin drip at current rate Recheck level at [place], [place]  [place] 02/13/2018,5:30 AM

## 2018-02-13 NOTE — Progress Notes (Addendum)
PULMONARY / CRITICAL CARE MEDICINE   Name: Nicole Hutchinson MRN: [id number] DOB: 1936/08/18    ADMISSION DATE:  02/08/2018 CONSULTATION DATE:  02/10/18  REFERRING MD:  Dr. Karleen Hampshire   CHIEF COMPLAINT:  SOB, LE Swelling  HISTORY OF PRESENT ILLNESS:   82 y/o F, never smoker, who presented to [place] on 5/7 with complaints of shortness of breath.   The patient's sister called her Cardiologist on 5/6 with c/o SOB and LE swelling.  She recently was started on lasix by her PCP.  She has chronic LE wounds and is followed by wound care.  Per notes, recently had BLE dopplers that were negative. Acute symptoms began around May 1st. She lives with her sister who reported she was more sleepy and had difficulty lying flat.  Her pastor noted she had been sleeping during church for the last two months.  She is on pradaxa for AF.  Sister called EMS 5/7 for increasing SOB.  EMS found her to be hypoxic with sats in the 80's on arrival.  She was admitted per [facility] to further evaluate hypoxic respiratory failure.  Work up notable for large right effusion on CXR, leukocytosis, FOBT positive and right thigh cellulitis.  UA negative.  Cultures pending.  The patient developed hypotension overnight on 5/8 and was transferred to ICU.  She required vasopressors for MAP.  She had altered mental status.  ABG 7.326 / 56.8 / 71 / 28.    PCCM consulted for pleural effusion and hypotension.  SUBJECTIVE:   Patient remains on BiPAP this morning Norepinephrine at 26 Low-dose Lasix drip but remains oliguric  VITAL SIGNS: BP 97/77   Pulse 95   Temp (!) 97.4 F (36.3 C) (Axillary)   Resp 19   Ht 4\' 6"  (1.372 m)   Wt 58 kg (127 lb 13.9 oz)   SpO2 100%   BMI 30.83 kg/m   HEMODYNAMICS:    VENTILATOR SETTINGS: FiO2 (%):  [40 %] 40 %  INTAKE / OUTPUT: I/O last 3 completed shifts: In: 3774.4 [I.V.:3474.4; IV Piggyback:300] Out: 605 [Urine:235; Chest Tube:370]  PHYSICAL EXAMINATION: General: Small frail elderly woman wearing  BiPAP HEENT: Mucous membranes moist, no secretions, BiPAP in place Neuro: Opens eyes to voice, did follow commands, tries to speak, moves her upper extremities with good strength CV: irregular, no murmur PULM: Coarse bilaterally, no wheezing few basilar crackles; chest tube no air leak currently GI: Soft, nontender, positive bowel sounds Extremities: Warm, dry 2+ lower extremity edema Skin: Some mild erythema in her upper right thigh  LABS:  BMET Recent Labs  Lab 02/12/18 2011 02/13/18 0201 02/13/18 0528  NA 131* QUESTIONABLE RESULTS, RECOMMEND RECOLLECT TO VERIFY 128*  K 4.8 QUESTIONABLE RESULTS, RECOMMEND RECOLLECT TO VERIFY 4.1  CL 98* QUESTIONABLE RESULTS, RECOMMEND RECOLLECT TO VERIFY 95*  CO2 21* QUESTIONABLE RESULTS, RECOMMEND RECOLLECT TO VERIFY 21*  BUN 48* QUESTIONABLE RESULTS, RECOMMEND RECOLLECT TO VERIFY 51*  CREATININE 2.36* QUESTIONABLE RESULTS, RECOMMEND RECOLLECT TO VERIFY 2.45*  GLUCOSE 226* QUESTIONABLE RESULTS, RECOMMEND RECOLLECT TO VERIFY 245*    Electrolytes Recent Labs  Lab 02/12/18 0133  02/12/18 2011 02/13/18 0201 02/13/18 0528  CALCIUM 7.1*   < > 6.8* QUESTIONABLE RESULTS, RECOMMEND RECOLLECT TO VERIFY 6.7*  MG 1.9  --   --   --   --   PHOS 6.1*  --   --   --   --    < > = values in this interval not displayed.    CBC Recent Labs  Lab  02/11/18 1455 02/12/18 0133 02/13/18 0201  WBC 33.4* 36.9* 30.0*  HGB 9.6* 10.2* 10.0*  HCT 31.3* 33.8* 33.2*  PLT 142* 174 146*    Coag's Recent Labs  Lab 02/12/18 1330  APTT 56*    Sepsis Markers Recent Labs  Lab 02/10/18 0810 02/11/18 1025 02/12/18 0133 02/12/18 1052 02/13/18 0201  LATICACIDVEN 2.2* 1.1  --  2.0*  --   PROCALCITON  --  3.84 3.65  --  4.31    ABG Recent Labs  Lab 02/08/2018 1630 02/10/18 0448 02/10/18 0930  PHART 7.343* 7.326* 7.307*  PCO2ART 55.6* 56.8* 57.9*  PO2ART 127* 71.8* 72.6*    Liver Enzymes Recent Labs  Lab 02/14/2018 1605 02/10/18 0200  02/11/18 0625  AST 19 21 27   ALT 15 14 21   ALKPHOS 78 69 73  BILITOT 0.5 0.7 0.6  ALBUMIN 2.9* 2.5* 2.2*    Cardiac Enzymes Recent Labs  Lab 02/11/18 0104 02/11/18 0625 02/11/18 1025  TROPONINI 0.85* 0.71* 0.74*    Glucose Recent Labs  Lab 02/10/18 0551 02/10/18 1303 02/12/18 1554 02/12/18 1934 02/12/18 2306 02/13/18 0314  GLUCAP 71 85 163* 186* 208* 184*    Imaging Dg Chest Port 1 View  Result Date: 02/13/2018 CLINICAL DATA:  Shortness of breath today.  Follow-up pneumothorax. EXAM: PORTABLE CHEST 1 VIEW COMPARISON:  Feb 12, 2018 FINDINGS: The heart size and mediastinal contours are stable. 5% right apical pneumothorax is noted. Right chest tube is unchanged. There bilateral pleural effusions with consolidation of bilateral lung bases. A right central venous line is identified distal tip probably in the right atrium unchanged. Subcutaneous emphysema of the right chest wall is noted, slightly decreased. The visualized skeletal structures are stable. IMPRESSION: 5% right apical pneumothorax noted. Persistent consolidation of both lung bases with bilateral pleural effusions. These results will be called to the ordering clinician or representative by the Radiologist Assistant, and communication documented in the PACS or zVision Dashboard. Electronically Signed   By: Abelardo Diesel M.D.   On: 02/13/2018 07:32   Dg Chest Port 1 View  Result Date: 02/12/2018 CLINICAL DATA:  Right chest tube placement for pneumothorax. EXAM: PORTABLE CHEST 1 VIEW COMPARISON:  Radiographs of same day. FINDINGS: Stable cardiomegaly. Stable moderate left pleural effusion is noted with associated atelectasis. Interval placement of pigtail catheter into right lung base. No definite pneumothorax is noted. There is interval development of mild right pleural effusion with associated atelectasis. A catheter appears to be kinked at the thoracic wall. Moderate amount of subcutaneous emphysema is seen overlying the  right chest wall and supraclavicular region. Right-sided PICC line is unchanged in position. IMPRESSION: Interval placement of pigtail drainage catheter into right lung base. No definite pneumothorax is noted, but interval development of mild right pleural effusion with associated atelectasis is noted. Pigtail drainage catheter appears to be kinked in the thoracic wall, with overlying subcutaneous emphysema. These results were called by telephone at the time of interpretation on 02/12/2018 at 4:28 pm to Garry Heater, the patient's nurse, who verbally acknowledged these results. Stable left basilar opacity is noted consistent with effusion and underlying atelectasis. Electronically Signed   By: Marijo Conception, M.D.   On: 02/12/2018 16:30   Dg Chest Port 1 View  Result Date: 02/12/2018 CLINICAL DATA:  82 year old female with pleural effusion and right thoracentesis EXAM: PORTABLE CHEST 1 VIEW COMPARISON:  02/12/2018, 02/10/2018 FINDINGS: Reduction in right-sided pleural effusion with interval development of subpulmonic pneumothorax. Opacity in the right lung at the hilum. Opacity  persists at the left lung base with obscuration of the hemidiaphragm and heart border. Interval development of myofacial/subcutaneous gas at the right neck. Unchanged position of right upper extremity PICC IMPRESSION: Interval reduction of right-sided pleural effusion with development of sub pulmonic pneumothorax, potentially ex vacuo. Interval development of subcutaneous gas at the right neck base, as well as along the right lateral chest. Persisting left-sided pleural effusion with atelectasis/consolidation. These results were called by telephone at the time of interpretation on 02/12/2018 at 1:26 pm to Mr Polly Cobia, caring for the patient. Electronically Signed   By: Corrie Mckusick D.O.   On: 02/12/2018 13:26     STUDIES:  Super D CT 03/05/17 >> near complete interval clearing of LLL lung nodule compatible with an  inflammatory or infectious process, similar appearance of partially loculated small to moderate right pleural effusion with associated pleural thickening, trace left pleural fluid, moderate pericardial effusion, CAD, gallstones ECHO 5/8 >> EF 65-70%, small to moderate pericardial effusion, cannot rule out tamponade physiology, PA peak 50 CT Head 5/8 >> no acute intracranial abnormality CT Leg 5/8 >> no osteomyelitis, anasarca with some soft tissue edema but no tracking infection LE Doppler 5/10 >>   CULTURES: UA 5/7 >> negative BCx2 5/7 >>  Pleural fluid 5/10 >>   ANTIBIOTICS: Vanco 5/7 >> 5/10 Zosyn 5/8 >> 5/9 Cefepime 5/9 >> 5/10 Ceftriaxone 5/10 >>   SIGNIFICANT EVENTS: 5/07  Admit with SOB, BLE  5/09 Off BiPAP / awake.  Levophed is weaning currently on 4 mics 5/10 [place]   LINES/TUBES: LUE PICC 5/8 >>  R pleural pigtail 5/10  DISCUSSION: 82 y/o F who presented to [facility] on 5/7 with reports of shortness of breath.    ASSESSMENT / PLAN:  PULMONARY A: Bilateral R>L Pleural Effusions, status post right thoracentesis Right pneumothorax, status post pigtail catheter placement Pulmonary Hypertension - PAS 58mmHg seen on ECHO  Acute Respiratory failure R/O DVT / PE P:   Continue BiPAP as needed.  She looks like she may be able to take a break this morning Lower extremity Doppler studies pending, Assess LE doppler for PE evaluation, d-dimer was in the normal range.  Consider discontinuation heparin on 5/11 given this information if any evidence of bleeding (also has atrial fibrillation) Push pulmonary hygiene Follow chest x-ray  CARDIOVASCULAR A:  Shock - initial concern for sepsis with right thigh cellulitis, rule out significant pericardial tamponade with ECHO. Hx of effusion on CT.  Suspect L/R CHF decompensation Moderate Pericardial Effusion - by CT on 5/8, ECHO  Pulmonary Hypertension Hx AF, HTN, HLD, Moderate MR P:  Continue norepinephrine, wean as able Continue  stress dose steroids Continue low-dose Lasix drip for at least another day, attempt to unload poorly functioning right ventricle Continue heparin drip for now given atrial fibrillation, discontinue if any evidence of active bleeding, dropping hemoglobin DNR/DNI  RENAL A:   Hyponatremia \ Acute renal failure, progressive 5/11 P:   Follow BMP, urine output Continue low-dose Lasix drip, she is becoming oliguric unclear whether this will rebound  GASTROINTESTINAL A:   Hx Colon Cancer s/p Resection - FOBT positive Melena - no active bleeding  P:   N.p.o.  HEMATOLOGIC A:   Leukocytosis - significant, up to 56k Anemia  Prior Anticoagulation  P:  Follow CBC Prior borderline positive ANA (1: 64), unclear contribution here  INFECTIOUS A:   R Thigh Cellulitis - doubt is primary source of hypotension as her leg exam seems out of proportion to her shock.  P:   Antibiotics simplified to ceftriaxone Continue to follow culture data including right pleural fluid  ENDOCRINE A:   Hyperthyroidism - TSH 1.579 5/8 P:    Do not see Synthroid her home medication list.  Need to confirm dosing. Start empiric low dose IV  synthroid 5/11  NEUROLOGIC A:   Acute Metabolic Encephalopathy - unclear etiology, CT head negative, slightly improved 5/11 Hx CVA P:   Continue to follow closely, frequent neuro exams   FAMILY  - Updates: Reviewed status, prognosis with her 2 sisters at bedside on 5/10.  Family is aware that should she arrest she is a DNR no chest compressions no ACLS no intubation.  Independent critical care time 33 minutes  Baltazar Apo, MD, PhD 02/13/2018, 7:54 AM  Pulmonary and Critical Care [phone] or if no answer [phone]

## 2018-02-14 ENCOUNTER — Inpatient Hospital Stay (HOSPITAL_COMMUNITY): Payer: Medicare PPO

## 2018-02-14 LAB — GLUCOSE, CAPILLARY
GLUCOSE-CAPILLARY: 168 mg/dL — AB (ref 65–99)
GLUCOSE-CAPILLARY: 105 mg/dL — AB (ref 65–99)
Glucose-Capillary: 138 mg/dL — ABNORMAL HIGH (ref 65–99)
Glucose-Capillary: 128 mg/dL — ABNORMAL HIGH (ref 65–99)
Glucose-Capillary: 111 mg/dL — ABNORMAL HIGH (ref 65–99)
Glucose-Capillary: 107 mg/dL — ABNORMAL HIGH (ref 65–99)

## 2018-02-14 LAB — HEPARIN LEVEL (UNFRACTIONATED)
HEPARIN UNFRACTIONATED: 0.45 [IU]/mL (ref 0.30–0.70)
Heparin Unfractionated: 0.64 IU/mL (ref 0.30–0.70)

## 2018-02-14 LAB — CBC
HCT: 30.9 % — ABNORMAL LOW (ref 36.0–46.0)
Hemoglobin: 9.5 g/dL — ABNORMAL LOW (ref 12.0–15.0)
MCH: 21.3 pg — AB (ref 26.0–34.0)
MCHC: 30.7 g/dL (ref 30.0–36.0)
MCV: 69.4 fL — AB (ref 78.0–100.0)
PLATELETS: 103 10*3/uL — AB (ref 150–400)
RBC: 4.45 MIL/uL (ref 3.87–5.11)
RDW: 24.1 % — AB (ref 11.5–15.5)
WBC: 38.4 10*3/uL — AB (ref 4.0–10.5)

## 2018-02-14 LAB — BASIC METABOLIC PANEL
Anion gap: 12 (ref 5–15)
BUN: 52 mg/dL — AB (ref 6–20)
CHLORIDE: 94 mmol/L — AB (ref 101–111)
CO2: 21 mmol/L — ABNORMAL LOW (ref 22–32)
CREATININE: 2.5 mg/dL — AB (ref 0.44–1.00)
Calcium: 7.3 mg/dL — ABNORMAL LOW (ref 8.9–10.3)
GFR calc Af Amer: 20 mL/min — ABNORMAL LOW (ref >60)
GFR calc non Af Amer: 17 mL/min — ABNORMAL LOW (ref >60)
Glucose, Bld: 182 mg/dL — ABNORMAL HIGH (ref 65–99)
Potassium: 4 mmol/L (ref 3.5–5.1)
Sodium: 127 mmol/L — ABNORMAL LOW (ref 135–145)

## 2018-02-14 LAB — MAGNESIUM: MAGNESIUM: 1.7 mg/dL (ref 1.7–2.4)

## 2018-02-14 LAB — CULTURE, BLOOD (ROUTINE X 2)
CULTURE: NO GROWTH
Culture: NO GROWTH
Special Requests: ADEQUATE
Special Requests: ADEQUATE

## 2018-02-14 MED ORDER — NOREPINEPHRINE BITARTRATE 1 MG/ML IV SOLN
0.0000 ug/min | INTRAVENOUS | Status: DC
  Administered 2018-02-14: 20 ug/min via INTRAVENOUS
  Filled 2018-02-14 (×2): qty 16

## 2018-02-14 NOTE — Progress Notes (Signed)
PULMONARY / CRITICAL CARE MEDICINE   Name: Nicole Hutchinson MRN: [id number] DOB: 18-Jun-1936    ADMISSION DATE:  02/27/2018 CONSULTATION DATE:  02/10/18  REFERRING MD:  Dr. Karleen Hampshire   CHIEF COMPLAINT:  SOB, LE Swelling  HISTORY OF PRESENT ILLNESS:   82 y/o F, never smoker, who presented to [facility] on 5/7 with complaints of shortness of breath.   The patient's sister called her Cardiologist on 5/6 with c/o SOB and LE swelling.  She recently was started on lasix by her PCP.  She has chronic LE wounds and is followed by wound care.  Per notes, recently had BLE dopplers that were negative. Acute symptoms began around May 1st. She lives with her sister who reported she was more sleepy and had difficulty lying flat.  Her pastor noted she had been sleeping during church for the last two months.  She is on pradaxa for AF.  Sister called EMS 5/7 for increasing SOB.  EMS found her to be hypoxic with sats in the 80's on arrival.  She was admitted per [facility] to further evaluate hypoxic respiratory failure.  Work up notable for large right effusion on CXR, leukocytosis, FOBT positive and right thigh cellulitis.  UA negative.  Cultures pending.  The patient developed hypotension overnight on 5/8 and was transferred to ICU.  She required vasopressors for MAP.  She had altered mental status.  ABG 7.326 / 56.8 / 71 / 28.    PCCM consulted for pleural effusion and hypotension.  SUBJECTIVE:   UOP increased, 343 cc over last 24h Not currently on BiPAP Norepinephrine 21, Lasix drip still running  VITAL SIGNS: BP (!) 93/31   Pulse 99   Temp (!) 95.8 F (35.4 C) (Oral)   Resp (!) 24   Ht 4\' 6"  (1.372 m)   Wt 58 kg (127 lb 13.9 oz)   SpO2 100%   BMI 30.83 kg/m   HEMODYNAMICS:    VENTILATOR SETTINGS: FiO2 (%):  [40 %] 40 %  INTAKE / OUTPUT: I/O last 3 completed shifts: In: 3372.2 [I.V.:3072.2; IV KGYJEHUDJ:497] Out: 0263 [Urine:388; Chest Tube:1360]  PHYSICAL EXAMINATION: General: Small frail ill-appearing  elderly woman HEENT: Mucous membranes moist Neuro: Opens eyes and attempts to communicate with stimulation, difficult to understand, globally weak but moves her bilateral upper extremities CV: Irregularly irregular, no murmur PULM: Coarse bilaterally, no wheezing, chest tube on the right with no leak GI: Soft, nontender, positive bowel sounds Extremities: 2+ lower extremity edema up to the thigh Skin: Mild erythema right upper thigh unchanged  LABS:  BMET Recent Labs  Lab 02/13/18 1249 02/13/18 2040 02/14/18 0332  NA 128* 130* 127*  K 4.2 4.3 4.0  CL 97* 93* 94*  CO2 21* 21* 21*  BUN 51* 51* 52*  CREATININE 2.49* 2.48* 2.50*  GLUCOSE 196* 186* 182*    Electrolytes Recent Labs  Lab 02/12/18 0133  02/13/18 1249 02/13/18 2040 02/14/18 0332  CALCIUM 7.1*   < > 6.9* 6.4* 7.3*  MG 1.9  --   --   --   --   PHOS 6.1*  --   --   --   --    < > = values in this interval not displayed.    CBC Recent Labs  Lab 02/12/18 0133 02/13/18 0201 02/14/18 0332  WBC 36.9* 30.0* 38.4*  HGB 10.2* 10.0* 9.5*  HCT 33.8* 33.2* 30.9*  PLT 174 146* 103*    Coag's Recent Labs  Lab 02/12/18 1330  APTT 56*  Sepsis Markers Recent Labs  Lab 02/10/18 0810 02/11/18 1025 02/12/18 0133 02/12/18 1052 02/13/18 0201  LATICACIDVEN 2.2* 1.1  --  2.0*  --   PROCALCITON  --  3.84 3.65  --  4.31    ABG Recent Labs  Lab 02/23/2018 1630 02/10/18 0448 02/10/18 0930  PHART 7.343* 7.326* 7.307*  PCO2ART 55.6* 56.8* 57.9*  PO2ART 127* 71.8* 72.6*    Liver Enzymes Recent Labs  Lab 02/19/2018 1605 02/10/18 0200 02/11/18 0625  AST 19 21 27   ALT 15 14 21   ALKPHOS 78 69 73  BILITOT 0.5 0.7 0.6  ALBUMIN 2.9* 2.5* 2.2*    Cardiac Enzymes Recent Labs  Lab 02/11/18 0104 02/11/18 0625 02/11/18 1025  TROPONINI 0.85* 0.71* 0.74*    Glucose Recent Labs  Lab 02/13/18 1130 02/13/18 1622 02/13/18 1931 02/13/18 2319 02/14/18 0353 02/14/18 0736  GLUCAP 198* 196* 175* 153* 138*  168*    Imaging Dg Chest Port 1 View  Result Date: 02/14/2018 CLINICAL DATA:  Pneumothorax EXAM: PORTABLE CHEST 1 VIEW COMPARISON:  Portable exam 0404 hours compared to 02/13/2018 FINDINGS: Rotated to the LEFT. RIGHT thoracostomy tube unchanged. RIGHT arm PICC line tip projects over SVC near cavoatrial junction. Minimal enlargement of cardiac silhouette. Small bibasilar pleural effusions and bi basilar air space opacity, greater on LEFT, unchanged. Minimal residual RIGHT apex pneumothorax. Bones demineralized. IMPRESSION: Minimal residual RIGHT apex pneumothorax. Persistent bibasilar airspace opacities and pleural effusions greater on LEFT. Electronically Signed   By: Lavonia Dana M.D.   On: 02/14/2018 07:03     STUDIES:  Super D CT 03/05/17 >> near complete interval clearing of LLL lung nodule compatible with an inflammatory or infectious process, similar appearance of partially loculated small to moderate right pleural effusion with associated pleural thickening, trace left pleural fluid, moderate pericardial effusion, CAD, gallstones ECHO 5/8 >> EF 65-70%, small to moderate pericardial effusion, cannot rule out tamponade physiology, PA peak 50 CT Head 5/8 >> no acute intracranial abnormality CT Leg 5/8 >> no osteomyelitis, anasarca with some soft tissue edema but no tracking infection LE Doppler 5/10 >>   CULTURES: UA 5/7 >> negative BCx2 5/7 >>  Pleural fluid 5/10 >>   ANTIBIOTICS: Vanco 5/7 >> 5/10 Zosyn 5/8 >> 5/9 Cefepime 5/9 >> 5/10 Ceftriaxone 5/10 >>   SIGNIFICANT EVENTS: 5/07  Admit with SOB, BLE  5/09 Off BiPAP / awake.  Levophed is weaning currently on 4 mics 5/10 Thora   LINES/TUBES: LUE PICC 5/8 >>  R pleural pigtail 5/10 >>   DISCUSSION: 82 y/o F who presented to [facility] on 5/7 with reports of shortness of breath.  Evolved Shock, AMS and moved to ICU. Suspect multifactorial shock including sepsis, source unclear  ASSESSMENT / PLAN:  PULMONARY A: Bilateral R>L  Pleural Effusions, status post right thoracentesis Right pneumothorax, status post pigtail catheter placement Secondary pulmonary Hypertension - PAS 45mmHg seen on ECHO  Acute Respiratory failure R/O DVT / PE, currently treating empirically with heparin P:   BiPAP available when needed.  Confirm that she would not want to be intubated Doppler studies without evidence of DVT Push pulmonary hygiene Follow chest x-ray Suspect we can probably place her chest tube to waterseal on 5/13, she has a small residual apical pneumothorax without any air leak  CARDIOVASCULAR A:  Shock - initial concern for sepsis with right thigh cellulitis, rule out significant pericardial tamponade with ECHO. Hx of effusion on CT.  Suspect L/R CHF decompensation Moderate Pericardial Effusion - by CT on 5/8,  ECHO  Pulmonary Hypertension Hx AF, HTN, HLD, Moderate MR P:  Wean norepinephrine as able Continue stress dose steroids She is tolerating low-dose Lasix drip, would like to try to jumpstart her renal function and potential he unload her poorly functioning right ventricle Continue heparin drip given her atrial fibrillation.  Discontinue with any evidence of bleeding DNR/DNI  RENAL A:   Hyponatremia \ Acute renal failure, stable over the last 48 hours P:   Follow BMP, urine output on low-dose Lasix drip  GASTROINTESTINAL A:   Hx Colon Cancer s/p Resection - FOBT positive Melena - no active bleeding  P:   NPO  HEMATOLOGIC A:   Leukocytosis - significant, up to 56k Anemia  Prior Anticoagulation  P:  Follow CBC Prior borderline positive ANA (1: 64), unclear contribution here  INFECTIOUS A:   R Thigh Cellulitis - doubt is primary source of hypotension as her leg exam seems out of proportion to her shock.  P:   Antibiotics simplified to ceftriaxone, unclear source Continue to follow culture data, including right pleural fluid  ENDOCRINE A:   Hyperthyroidism - TSH 1.579 5/8 P:    Low-dose IV  Synthroid started on 5/11  NEUROLOGIC A:   Acute Metabolic Encephalopathy - unclear etiology, CT head negative, slightly improved 5/11 Hx CVA P:   Follow closely, frequent neuro exams   FAMILY  - Updates: Updated patient's sisters at bedside on 5/11 and have been doing so daily.  They understand that she is tenuous and has not shown much improvement.  If no change over the next 1 to 2 days then they understand we may need to transition to comfort.  All are in agreement that she should not be intubated, should not undergo ACLS.  Independent critical care time 31 minutes  Baltazar Apo, MD, PhD 02/14/2018, 8:26 AM  Pulmonary and Critical Care [phone] or if no answer [phone]

## 2018-02-14 NOTE — Progress Notes (Signed)
RN notified patient experienced five beats of V-tach.  Dr. Lamonte Sakai updated on patients condition, received order for to checkMagnesium.  Will continue to follow up.

## 2018-02-14 NOTE — Progress Notes (Signed)
ANTICOAGULATION CONSULT NOTE - Follow Up Consult  Pharmacy Consult for Heparin Indication: atrial fibrillation  Allergies  Allergen Reactions  . Ace Inhibitors     REACTION: cough  . Codeine     Patient Measurements: Height: 4\' 6"  (137.2 cm) Weight: 127 lb 13.9 oz (58 kg) IBW/kg (Calculated) : 31.7 Heparin Dosing Weight:   Vital Signs: Temp: 96.4 F (35.8 C) (05/11 2338) Temp Source: Axillary (05/11 2338) BP: 84/47 (05/11 2300) Pulse Rate: 94 (05/11 2105)  Labs: Recent Labs    02/11/18 0104  02/11/18 0625 02/11/18 1025 02/11/18 1455 02/12/18 0133  02/12/18 1330  02/13/18 0201 02/13/18 0528 02/13/18 1249 02/13/18 2040 02/13/18 2200  HGB 10.1*  --   --   --  9.6* 10.2*  --   --   --  10.0*  --   --   --   --   HCT 33.3*  --   --   --  31.3* 33.8*  --   --   --  33.2*  --   --   --   --   PLT 164  --   --   --  142* 174  --   --   --  146*  --   --   --   --   APTT  --   --   --   --   --   --   --  56*  --   --   --   --   --   --   HEPARINUNFRC  --   --   --   --   --   --    < > <0.10*  --  0.59  --  0.17*  --  0.29*  CREATININE  --    < > 1.58*  --   --  1.96*   < > 2.04*   < > QUESTIONABLE RESULTS, RECOMMEND RECOLLECT TO VERIFY 2.45* 2.49* 2.48*  --   TROPONINI 0.85*  --  0.71* 0.74*  --   --   --   --   --   --   --   --   --   --    < > = values in this interval not displayed.    Estimated Creatinine Clearance: 12.1 mL/min (A) (by C-G formula based on SCr of 2.48 mg/dL (H)).   Medications:  Infusions:  . sodium chloride 10 mL/hr at 02/12/18 1547  . calcium gluconate    . cefTRIAXone (ROCEPHIN)  IV Stopped (02/13/18 2130)  . famotidine (PEPCID) IV Stopped (02/13/18 2130)  . furosemide (LASIX) infusion 4 mg/hr (02/13/18 2300)  . heparin 900 Units/hr (02/13/18 2300)  . norepinephrine (LEVOPHED) Adult infusion 15 mcg/min (02/13/18 2349)    Assessment: Patient with increased but still low heparin level.  No heparin issues per RN.  Goal of Therapy:   Heparin level 0.3-0.7 units/ml Monitor platelets by anticoagulation protocol: Yes   Plan:  Increase heparin to 1000 units/hr Recheck level at 0900  Tyler Deis, Shea Stakes Crowford 02/14/2018,12:05 AM

## 2018-02-14 NOTE — Progress Notes (Signed)
ANTICOAGULATION CONSULT NOTE - Follow Consult  Pharmacy Consult for Heparin Indication: atrial fibrillation  Allergies  Allergen Reactions  . Ace Inhibitors     REACTION: cough  . Codeine     Patient Measurements: Height: 4\' 6"  (137.2 cm) Weight: 127 lb 13.9 oz (58 kg) IBW/kg (Calculated) : 31.7 Heparin Dosing Weight: 45.1  Vital Signs: Temp: 97.4 F (36.3 C) (05/12 1600) Temp Source: Oral (05/12 1600) BP: 120/55 (05/12 1900)  Labs: Recent Labs    02/12/18 0133  02/12/18 1330  02/13/18 0201  02/13/18 1249 02/13/18 2040 02/13/18 2200 02/14/18 0332 02/14/18 1027 02/14/18 1845  HGB 10.2*  --   --   --  10.0*  --   --   --   --  9.5*  --   --   HCT 33.8*  --   --   --  33.2*  --   --   --   --  30.9*  --   --   PLT 174  --   --   --  146*  --   --   --   --  103*  --   --   APTT  --   --  56*  --   --   --   --   --   --   --   --   --   HEPARINUNFRC  --    < > <0.10*  --  0.59  --  0.17*  --  0.29*  --  0.45 0.64  CREATININE 1.96*   < > 2.04*   < > QUESTIONABLE RESULTS, RECOMMEND RECOLLECT TO VERIFY   < > 2.49* 2.48*  --  2.50*  --   --    < > = values in this interval not displayed.    Estimated Creatinine Clearance: 12 mL/min (A) (by C-G formula based on SCr of 2.5 mg/dL (H)).   Medications:  Scheduled:  . Chlorhexidine Gluconate Cloth  6 each Topical Q0600  . collagenase   Topical Daily  . hydrocortisone sod succinate (SOLU-CORTEF) inj  50 mg Intravenous Q6H  . insulin aspart  0-15 Units Subcutaneous Q4H  . latanoprost  1 drop Both Eyes QHS  . levothyroxine  12.5 mcg Intravenous Daily  . sodium chloride flush  10-40 mL Intracatheter Q12H   Infusions:  . sodium chloride 10 mL/hr at 02/12/18 1547  . cefTRIAXone (ROCEPHIN)  IV Stopped (02/13/18 2130)  . famotidine (PEPCID) IV Stopped (02/13/18 2130)  . furosemide (LASIX) infusion 4 mg/hr (02/13/18 2300)  . heparin 1,000 Units/hr (02/14/18 1800)  . norepinephrine (LEVOPHED) Adult infusion 12 mcg/min  (02/14/18 1410)    Assessment: 27 yoF admitted 5/7 with SOB related to recurrent pleural effusions and sepsis related to thigh abscess.  PMH is significant for chronic afib on Pradaxa anticoagulation.  PTA Pradaxa initially continued on admission.  Most recent dose given on 5/8 at 02:00.  Now on hold for possible GI bleeding; using SCDs alone for VTE prophylaxis.  5/8 GI eval: heme + stool and new microcytic anemia on Pradaxa. No overt GI bleed.  Will need workup microcytic anemia when acute medical issues resolve. Transfuse PRBC, IV famotidine.  5/10:  PCCM concerned for increased WOB.  R/o DVT/PE.  Unable to do CTa given worsening renal function.  Pharmacy is consulted to dose Heparin for Afib and r/o VTE.  Begin after thoracentesis.  Today, 02/14/2018:  Heparin level 0.45, therapeutic on heparin at 10 ml/hr  RN reports no  IV line problems or pauses.  Infusing well via PICC line.  CBC: Hgb 9.5 (improved s/p PRBC transfusion on 5/8), Plt down to 103  No bleeding or complications reported.  Discussed possible GI bleeding with PCCM and GI service on 5/10.  No known active bleeding.  OK to start heparin and monitor.  Continue IV famotidine.  SCr increasing to 2.5, CrCl ~ 12 ml/min  Confirmatory heparin level this PM is 0.64, within the target range but trending up on 1000 units/hr. I'm concerned it will be supratherapeutic by the AM.  Goal of Therapy:  Heparin level 0.3-0.7 units/ml Monitor platelets by anticoagulation protocol: Yes   Plan:   Decrease heparin IV infusion conservatively to 900 units/hr  Follow-up AM heparin level.  Daily heparin level and CBC  Continue to monitor H&H and platelets, s/s GI bleeding  Romeo Rabon, PharmD. Mobile: [phone]. 02/14/2018,7:53 PM.

## 2018-02-14 NOTE — Progress Notes (Signed)
ANTICOAGULATION CONSULT NOTE - Follow Consult  Pharmacy Consult for Heparin Indication: atrial fibrillation  Allergies  Allergen Reactions  . Ace Inhibitors     REACTION: cough  . Codeine     Patient Measurements: Height: 4\' 6"  (137.2 cm) Weight: 127 lb 13.9 oz (58 kg) IBW/kg (Calculated) : 31.7 Heparin Dosing Weight: 45.1  Vital Signs: Temp: 95.8 F (35.4 C) (05/12 0758) Temp Source: Oral (05/12 0758) BP: 93/31 (05/12 0815) Pulse Rate: 99 (05/12 0736)  Labs: Recent Labs    02/11/18 1025  02/12/18 0133  02/12/18 1330  02/13/18 0201  02/13/18 1249 02/13/18 2040 02/13/18 2200 02/14/18 0332  HGB  --    < > 10.2*  --   --   --  10.0*  --   --   --   --  9.5*  HCT  --    < > 33.8*  --   --   --  33.2*  --   --   --   --  30.9*  PLT  --    < > 174  --   --   --  146*  --   --   --   --  103*  APTT  --   --   --   --  56*  --   --   --   --   --   --   --   HEPARINUNFRC  --   --   --    < > <0.10*  --  0.59  --  0.17*  --  0.29*  --   CREATININE  --   --  1.96*   < > 2.04*   < > QUESTIONABLE RESULTS, RECOMMEND RECOLLECT TO VERIFY   < > 2.49* 2.48*  --  2.50*  TROPONINI 0.74*  --   --   --   --   --   --   --   --   --   --   --    < > = values in this interval not displayed.    Estimated Creatinine Clearance: 12 mL/min (A) (by C-G formula based on SCr of 2.5 mg/dL (H)).   Medications:  Scheduled:  . Chlorhexidine Gluconate Cloth  6 each Topical Q0600  . collagenase   Topical Daily  . hydrocortisone sod succinate (SOLU-CORTEF) inj  50 mg Intravenous Q6H  . insulin aspart  0-15 Units Subcutaneous Q4H  . latanoprost  1 drop Both Eyes QHS  . levothyroxine  12.5 mcg Intravenous Daily  . sodium chloride flush  10-40 mL Intracatheter Q12H   Infusions:  . sodium chloride 10 mL/hr at 02/12/18 1547  . cefTRIAXone (ROCEPHIN)  IV Stopped (02/13/18 2130)  . famotidine (PEPCID) IV Stopped (02/13/18 2130)  . furosemide (LASIX) infusion 4 mg/hr (02/13/18 2300)  . heparin  1,000 Units/hr (02/14/18 0339)  . norepinephrine (LEVOPHED) Adult infusion      Assessment: 65 yoF admitted 5/7 with SOB related to recurrent pleural effusions and sepsis related to thigh abscess.  PMH is significant for chronic afib on Pradaxa anticoagulation.  PTA Pradaxa initially continued on admission.  Most recent dose given on 5/8 at 02:00.  Now on hold for possible GI bleeding; using SCDs alone for VTE prophylaxis.  5/8 GI eval: heme + stool and new microcytic anemia on Pradaxa. No overt GI bleed.  Will need workup microcytic anemia when acute medical issues resolve. Transfuse PRBC, IV famotidine.  5/10:  PCCM concerned for increased WOB.  R/o DVT/PE.  Unable to do CTa given worsening renal function.  Pharmacy is consulted to dose Heparin for Afib and r/o VTE.  Begin after thoracentesis.  Today, 02/14/2018:  Heparin level 0.45, therapeutic on heparin at 10 ml/hr  RN reports no IV line problems or pauses.  Infusing well via PICC line.  CBC: Hgb 9.5 (improved s/p PRBC transfusion on 5/8), Plt down to 103  No bleeding or complications reported.  Discussed possible GI bleeding with PCCM and GI service on 5/10.  No known active bleeding.  OK to start heparin and monitor.  Continue IV famotidine.  SCr increasing to 2.5, CrCl ~ 12 ml/min   Goal of Therapy:  Heparin level 0.3-0.7 units/ml Monitor platelets by anticoagulation protocol: Yes   Plan:   Continue heparin IV infusion at 1000 units/hr  Heparin level 8 hours to confirm therapeutic rate.  Daily heparin level and CBC  Continue to monitor H&H and platelets, s/s GI bleeding   Gretta Arab PharmD, BCPS Pager [phone] 02/14/2018 8:29 AM

## 2018-02-15 ENCOUNTER — Inpatient Hospital Stay (HOSPITAL_COMMUNITY): Payer: Medicare PPO

## 2018-02-15 LAB — GLUCOSE, CAPILLARY: Glucose-Capillary: 102 mg/dL — ABNORMAL HIGH (ref 65–99)

## 2018-02-16 LAB — BODY FLUID CULTURE: Culture: NO GROWTH

## 2018-02-18 ENCOUNTER — Telehealth: Payer: Self-pay

## 2018-02-18 NOTE — Telephone Encounter (Signed)
On 02/18/18 I received a d/c from [employer] ([place]) The d/c is for burial. The patient is a patient of Doctor Byrum.   The d/c will be taken to Pulmonary Unit for signature.   On 02/22/18 I received the d/c back from Doctor Byrum.  I got the d/c ready and called the funeral home to let them know I mailed the d/c to vital records per the funeral home request.

## 2018-03-06 NOTE — Progress Notes (Signed)
eLink Physician-Brief Progress Note Patient Name: Nicole Hutchinson DOB: 1935/12/04 MRN: [id number]   Date of Service  2018-02-24  HPI/Events of Note  Patient lost pulse and BP. Agonal wide complex electrical rhythm on bedside monitor. Patient is no CPR, intubation or defibrillation. However, is a Banker code. No response the 3 epinephrine rounds and NaHCO3. No pulse or BP. Code called at 02/24/18 at 4:07 AM.  eICU Interventions  Nursing staff has notified the patient's family of her deterioration and they are on their way into the hospital at this time.      Intervention Category Major Interventions: Code management / supervision  Sommer,Steven Eugene 02/24/18, 4:01 AM

## 2018-03-06 NOTE — Progress Notes (Signed)
Notified patients sister Estill Bamberg at [phone] of patients deterioration and they arrive as soon as possible.

## 2018-03-06 DEATH — deceased

## 2018-03-12 ENCOUNTER — Ambulatory Visit: Payer: Medicare PPO | Admitting: Internal Medicine

## 2018-03-12 ENCOUNTER — Encounter

## 2018-03-17 DIAGNOSIS — N179 Acute kidney failure, unspecified: Secondary | ICD-10-CM | POA: Diagnosis not present

## 2018-03-17 DIAGNOSIS — R6521 Severe sepsis with septic shock: Secondary | ICD-10-CM

## 2018-03-17 DIAGNOSIS — G9341 Metabolic encephalopathy: Secondary | ICD-10-CM | POA: Diagnosis present

## 2018-03-17 DIAGNOSIS — L03119 Cellulitis of unspecified part of limb: Secondary | ICD-10-CM | POA: Diagnosis present

## 2018-03-17 DIAGNOSIS — I3139 Other pericardial effusion (noninflammatory): Secondary | ICD-10-CM | POA: Diagnosis present

## 2018-03-17 DIAGNOSIS — I272 Pulmonary hypertension, unspecified: Secondary | ICD-10-CM | POA: Diagnosis present

## 2018-03-17 DIAGNOSIS — A419 Sepsis, unspecified organism: Secondary | ICD-10-CM | POA: Diagnosis present

## 2018-03-17 DIAGNOSIS — J939 Pneumothorax, unspecified: Secondary | ICD-10-CM | POA: Diagnosis not present

## 2018-03-17 DIAGNOSIS — I313 Pericardial effusion (noninflammatory): Secondary | ICD-10-CM | POA: Diagnosis present

## 2018-03-17 DIAGNOSIS — J9601 Acute respiratory failure with hypoxia: Secondary | ICD-10-CM | POA: Diagnosis present

## 2018-04-05 NOTE — Death Summary Note (Signed)
  DEATH SUMMARY   Patient Details  Name: Nicole Hutchinson MRN: [id number] DOB: Dec 25, 1935  Admission/Discharge Information   Admit Date:  2018-03-05  Date of Death: Date of Death: 03-11-2018  Time of Death: Time of Death: 0407  Length of Stay: 6  Referring Physician: Renato Shin, MD   Reason(s) for Hospitalization  Acute lethargy and encephalopathy  Diagnoses  Preliminary cause of death: Septic Shock Secondary Diagnoses (including complications and co-morbidities):  Active Problems:   Essential hypertension   ATRIAL FIBRILLATION   PERIPHERAL EDEMA   Hyperglycemia   Iron deficiency anemia   Pleural effusion, right   Leucocytosis   GI bleed   SIRS (systemic inflammatory response syndrome) (HCC)   Septic shock (HCC)   Acute respiratory failure with hypoxia (HCC)   Pneumothorax   Cellulitis of thigh   Pericardial effusion   Pulmonary hypertension (HCC)   Acute renal failure (ARF) (HCC)   Acute metabolic encephalopathy   Brief Hospital Course (including significant findings, care, treatment, and services provided and events leading to death)  [name] is a 82 y.o. year old female with hx of A Fib, lung cancer, HTN, moderate MR, hx CVA.  She was brought for evaluation of acute dyspnea, encephalopathy, weakness on 2018/03/05.  She was found to be hypotensive with leukocytosis.  Lactic acid was reassuring.  There was an area of induration and erythema on her upper right thigh.  Her stool guaiac was positive.  She required BiPAP support for hypoxemic respiratory failure and pressors were initiated.  She was treated with broad-spectrum antibiotics for her cellulitis and presumed septic shock.  She had a moderate right pleural effusion it was felt that her respiratory status would improve with thoracentesis.  This was done and a small right pneumothorax resulted.  The chest tube was placed in the pneumothorax resolved.  She was also noted to have a moderate pericardial effusion,  clinical significance unclear.  She was treated with stress dose steroids But her blood pressure remained low and she continued to require pressors. On the evening of Mar 11, 2018 she experienced progressive hypotension, a loss of pulse and blood pressure, PEA on monitor with a widening QRS complex.  Her CODE STATUS was DNR and no CPR was performed.  She expired 03/11/18    Pertinent Labs and Studies  Significant Diagnostic Studies No results found.  Microbiology No results found for this or any previous visit (from the past 240 hour(s)).  Lab Basic Metabolic Panel: No results for input(s): NA, K, CL, CO2, GLUCOSE, BUN, CREATININE, CALCIUM, MG, PHOS in the last 168 hours. Liver Function Tests: No results for input(s): AST, ALT, ALKPHOS, BILITOT, PROT, ALBUMIN in the last 168 hours. No results for input(s): LIPASE, AMYLASE in the last 168 hours. No results for input(s): AMMONIA in the last 168 hours. CBC: No results for input(s): WBC, NEUTROABS, HGB, HCT, MCV, PLT in the last 168 hours. Cardiac Enzymes: No results for input(s): CKTOTAL, CKMB, CKMBINDEX, TROPONINI in the last 168 hours. Sepsis Labs: No results for input(s): PROCALCITON, WBC, LATICACIDVEN in the last 168 hours.  Procedures/Operations   LUE PICC 5/8  R pleural pigtail 5/10     Rose Fillers Micalah Cabezas 03/17/2018, 5:17 PM

## 2018-05-07 ENCOUNTER — Encounter: Payer: Medicare PPO | Admitting: Endocrinology

## 2019-04-01 IMAGING — CT NM PET TUM IMG INITIAL (PI) SKULL BASE T - THIGH
8 series · 25 of 25 positions shown · non-contrast
Comparison: CT chest dated 12/29/2016

CLINICAL DATA: Initial treatment strategy for left lower lobe lung
nodule.

EXAM:
NUCLEAR MEDICINE PET SKULL BASE TO THIGH
TECHNIQUE: 7.3 mCi F-18 FDG was injected intravenously. Full-ring PET imaging
was performed from the skull base to thigh after the radiotracer. CT
data was obtained and used for attenuation correction and anatomic
localization.
FASTING BLOOD GLUCOSE:  Value: One hundred ten mg/dl

[Series 3: pet sk_thigh ac · axial · 5.0mm · 4.07mm/px · z∈[-795,+49]mm · 6 of 212 slices shown]
[im 1/212]
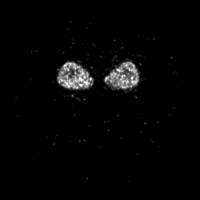
[im 43/212]
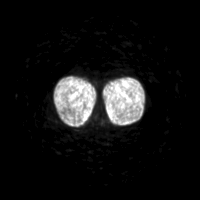
[im 85/212]
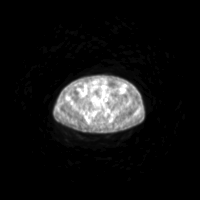
[im 127/212]
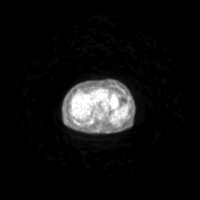
[im 169/212]
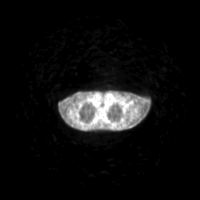
[im 212/212]
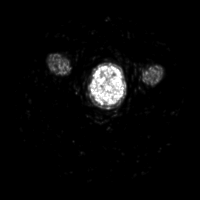

[Series 4: ct sk_thigh 5.0 b31f · axial · 5.0mm · 0.98mm/px · z∈[-795,+49]mm · 5 of 212 slices shown]
[im 1/212]
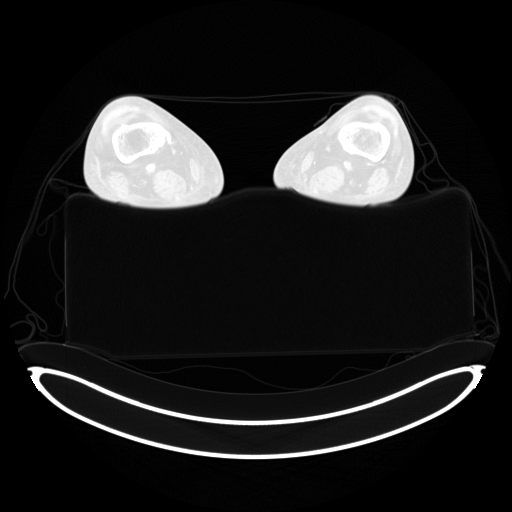
[im 53/212]
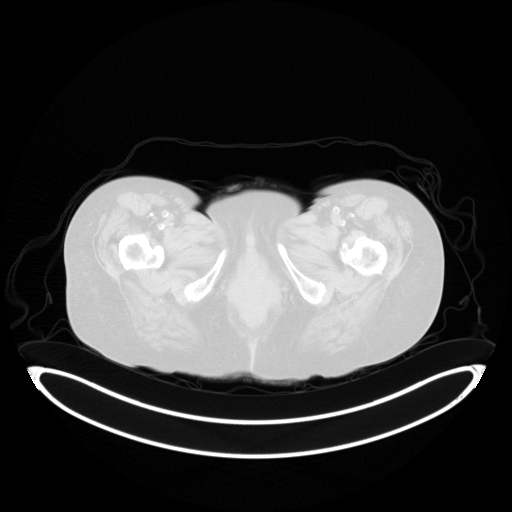
[im 106/212]
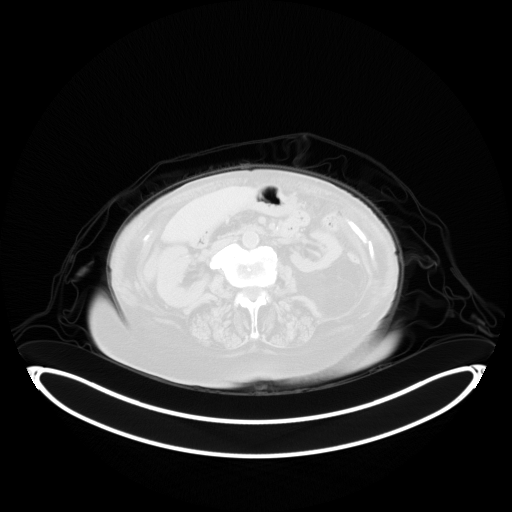
[im 159/212]
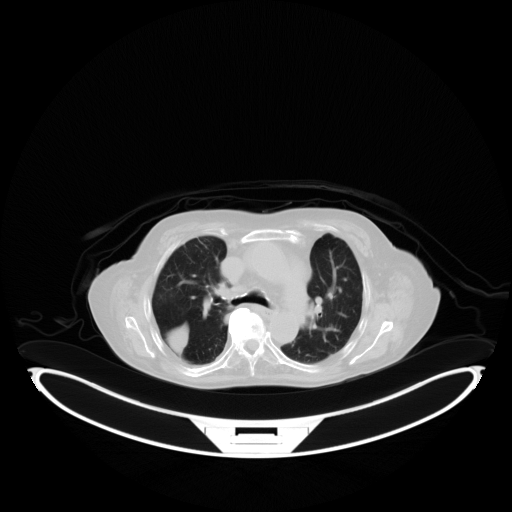
[im 212/212  brain]
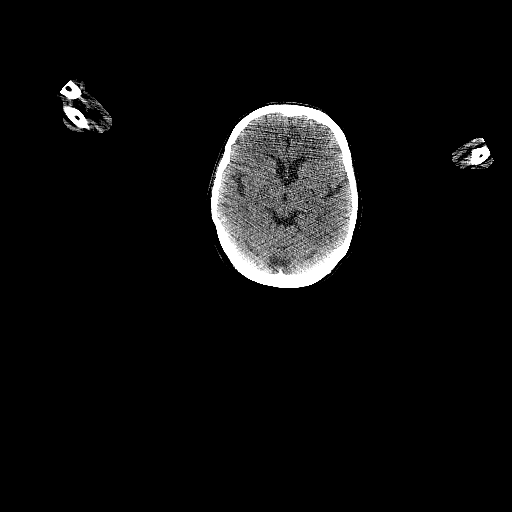

[Series 7: pet sk_thigh nac · axial · 5.0mm · 4.07mm/px · z∈[-795,+49]mm · 5 of 212 slices shown]
[im 1/212]
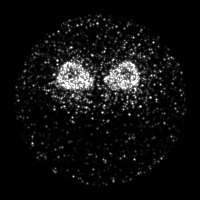
[im 53/212]
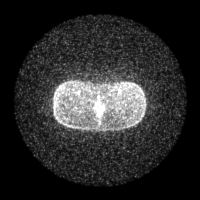
[im 106/212]
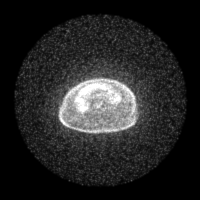
[im 159/212]
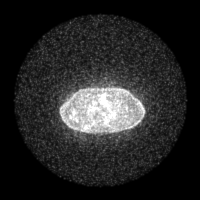
[im 212/212]
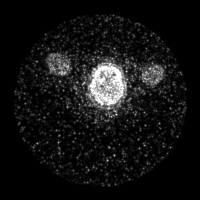

[Series 8: ct sk_thigh 5.0 b70f lung_bone · axial · 5.0mm · 0.61mm/px · 1 of 57 slices shown]
[im 1/57  bone]
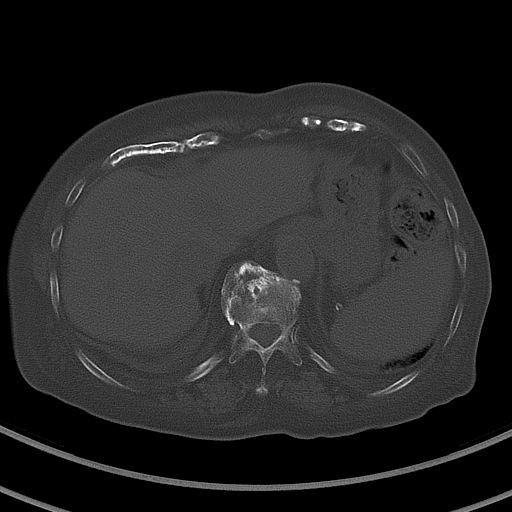

[Series 604: mip collection · coronal · 1.75mm/px · 1 of 32 slices shown]
[im 1/32]
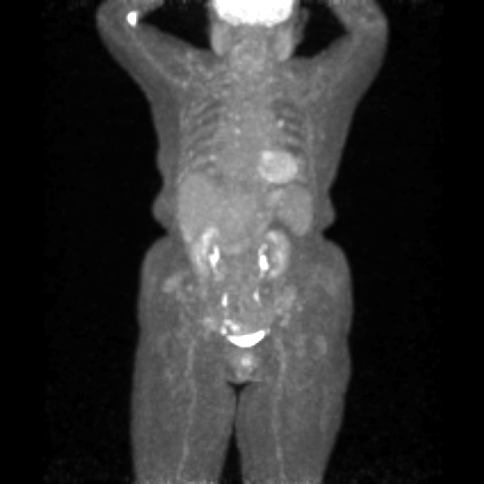

[Series 605: range-ct sk_thigh 5.0 (id)<alpha range> · 1 of 59 slices shown (1 of 2)]
[im 1/59]
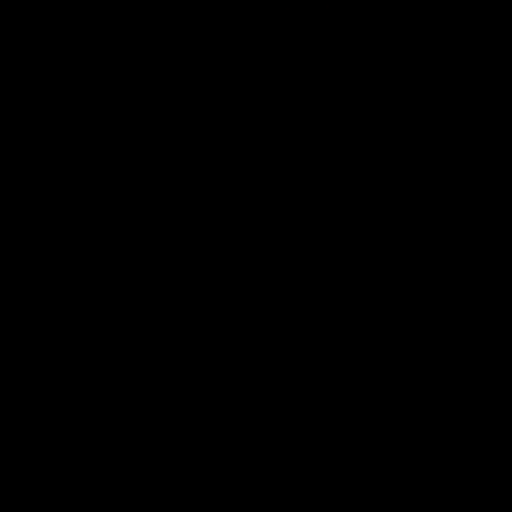

[Series 606: range-ct sk_thigh 5.0 (id)<alpha range> · 5 of 198 slices shown (2 of 2)]
[im 1/198]
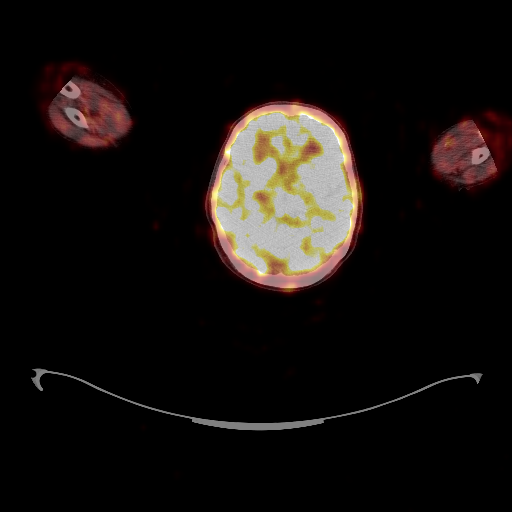
[im 50/198]
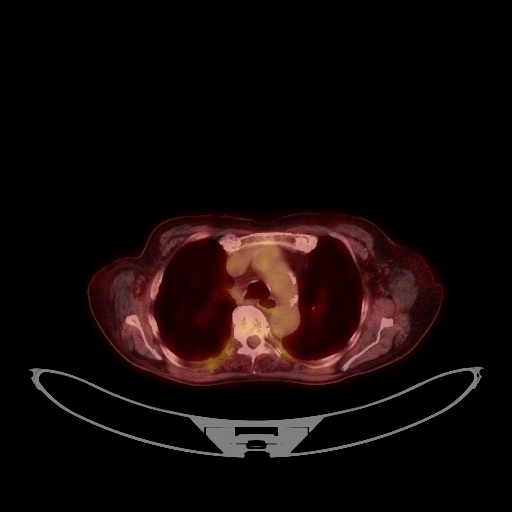
[im 99/198]
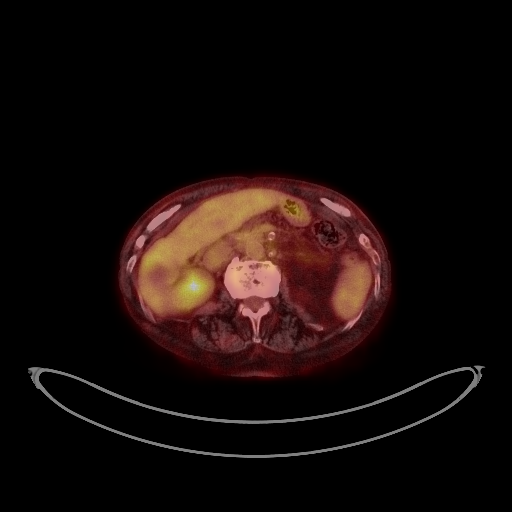
[im 148/198]
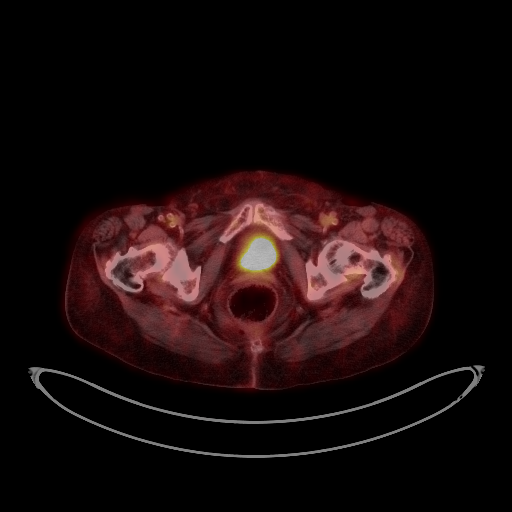
[im 198/198]
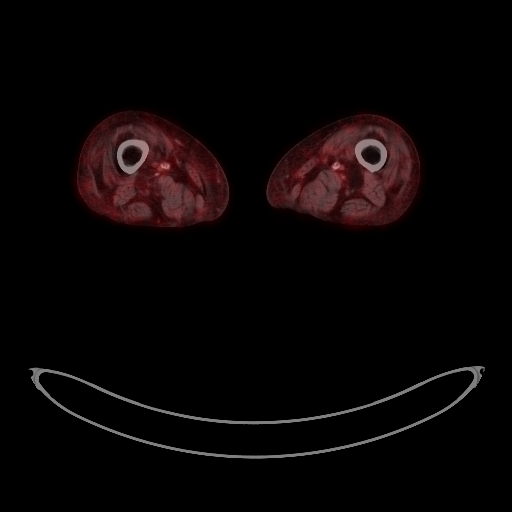

[Series 1032: results mm oncology reading · 5.0mm · 0.70mm/px · 1 of 2 slices shown]
[im 1/2]
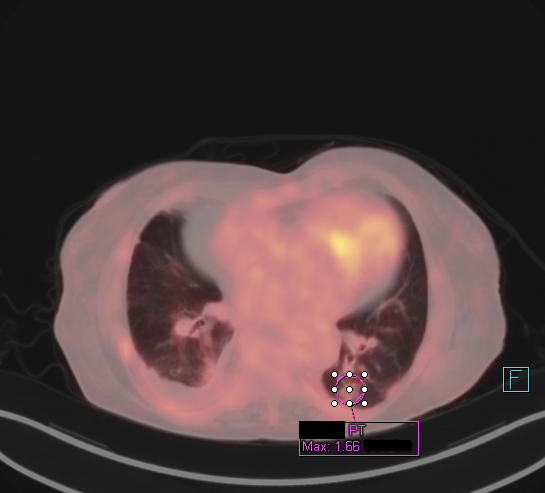

[25 of 25 positions shown; findings below may reference images not displayed]

FINDINGS: NECK

No hypermetabolic lymph nodes in the neck.

Mild chronic sphenoid, ethmoid, and maxillary sinusitis.

Carotid atherosclerotic calcification bilaterally.

CHEST

The [DATE] by 1.6 cm (as measured on image 38/8) left lower lobe
partially solid nodule has a maximum SUV of 1.7.

Faint focus of left hilar activity with maximum SUV

Background mediastinal blood pool activity 2.5.

Right pleural effusion with abnormal pleural thickening and some
loculation anteriorly and along the fissures. Trace left pleural
effusion also is potentially loculated. There is a moderate
pericardial effusion, mild cardiomegaly, and pectus excavatum.

Coronary, aortic arch, and branch vessel atherosclerotic vascular
disease.

ABDOMEN/PELVIS

Hyperdense embolic coils in the left upper quadrant. Layering
gallstones in the gallbladder.

Aortoiliac atherosclerotic vascular disease. Anastomotic staple line
along the rectum. No findings of hypermetabolic malignancy in the
abdomen or pelvis.

SKELETON

There is some faint heterogeneity of activity in the bony structures
primarily attributable to arthropathy. Degenerative arthropathy of
both hips. Considerable spurring in the thoracolumbar spine.
Considerable degenerative glenohumeral arthropathy.

Incidental right antecubital accentuated activity corresponds to
injection site.
IMPRESSION: 1. Part solid left lower lobe nodule is not highly hypermetabolic,
maximum SUV 1.7. However, it is not significantly changed over the
last 10 days. Low-grade malignancy versus inflammatory lesion.
2. There is a small focus of mildly hypermetabolic left hilar
activity, without a definite CT correlate, maximum SUV 4.1 as
compared to adjacent background mediastinal blood pool activity SUV
of 2.5. Appearance mildly abnormal but nonspecific for malignancy
versus reactive disease.
3. Loculated bilateral pleural effusions, larger on the right than
the left. No obvious hypermetabolic activity along the pleural
effusions.
4. Considerable atherosclerosis as noted above.
5. Considerable degenerative findings in the thoracic and lumbar
spine.
6. Cardiomegaly.
7. Moderate pericardial effusion.
8. Mild chronic paranasal sinusitis.
